# Patient Record
Sex: Male | Born: 1950 | Race: White | Hispanic: No | Marital: Married | Smoking: Never smoker
Health system: Southern US, Community
[De-identification: ages and names within clinical notes are randomized; demographics above are authoritative.]

## PROBLEM LIST (undated history)

## (undated) DIAGNOSIS — R7303 Prediabetes: Secondary | ICD-10-CM

## (undated) DIAGNOSIS — M1990 Unspecified osteoarthritis, unspecified site: Secondary | ICD-10-CM

## (undated) DIAGNOSIS — I219 Acute myocardial infarction, unspecified: Secondary | ICD-10-CM

## (undated) DIAGNOSIS — I10 Essential (primary) hypertension: Secondary | ICD-10-CM

## (undated) HISTORY — PX: MULTIPLE TOOTH EXTRACTIONS: SHX2053

## (undated) HISTORY — DX: Acute myocardial infarction, unspecified: I21.9

## (undated) HISTORY — PX: KNEE ARTHROSCOPY: SHX127

---

## 1997-11-06 ENCOUNTER — Ambulatory Visit (HOSPITAL_COMMUNITY): Admission: RE | Admit: 1997-11-06 | Discharge: 1997-11-06 | Payer: Self-pay | Admitting: Family Medicine

## 1997-11-06 ENCOUNTER — Encounter: Payer: Self-pay | Admitting: Family Medicine

## 2007-05-09 ENCOUNTER — Ambulatory Visit (HOSPITAL_COMMUNITY): Admission: RE | Admit: 2007-05-09 | Discharge: 2007-05-09 | Payer: Self-pay | Admitting: Orthopedic Surgery

## 2007-07-04 ENCOUNTER — Ambulatory Visit (HOSPITAL_BASED_OUTPATIENT_CLINIC_OR_DEPARTMENT_OTHER): Admission: RE | Admit: 2007-07-04 | Discharge: 2007-07-04 | Payer: Self-pay | Admitting: Orthopedic Surgery

## 2009-04-21 IMAGING — CR DG ORBITS FOR FOREIGN BODY
3 series · 3 of 3 positions shown · non-contrast
Comparison: None.

CLINICAL DATA: Pre MRI screening.  History of having metal
fragments removed from I S.

ORBITS FOR FOREIGN BODY - 2 VIEW

[view not recorded (1 of 3)]
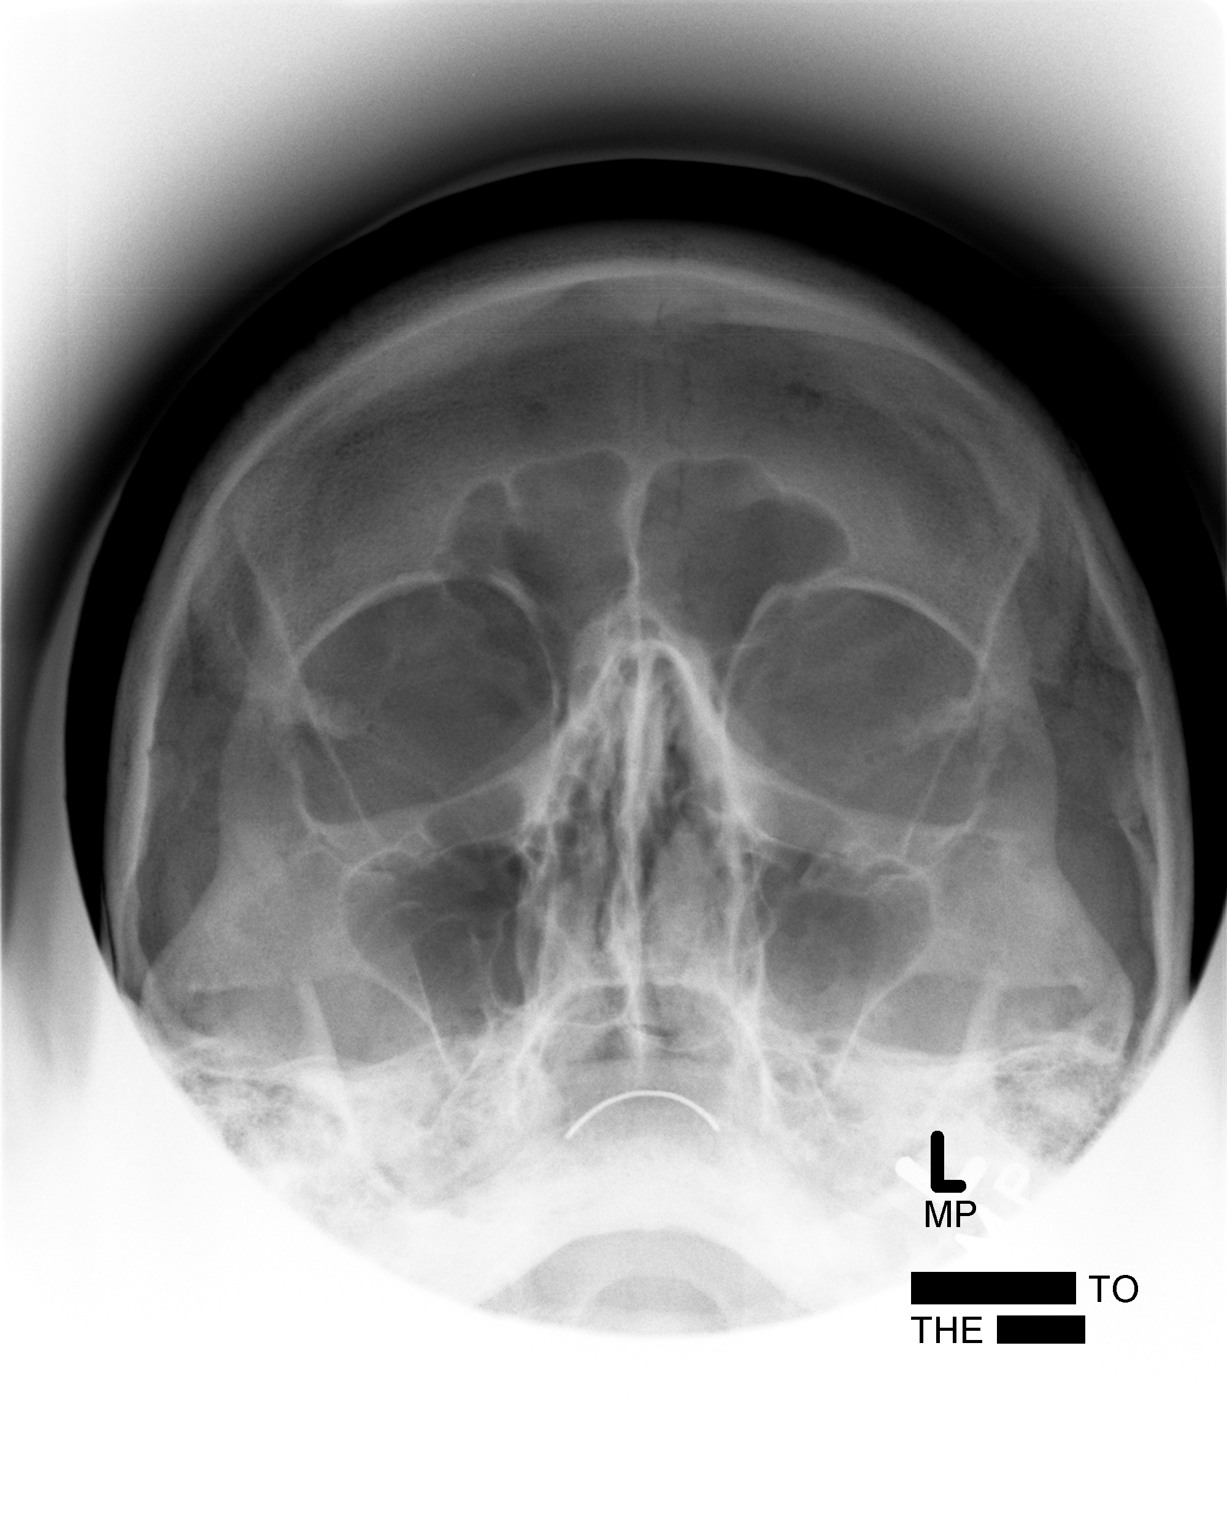

[view not recorded (2 of 3)]
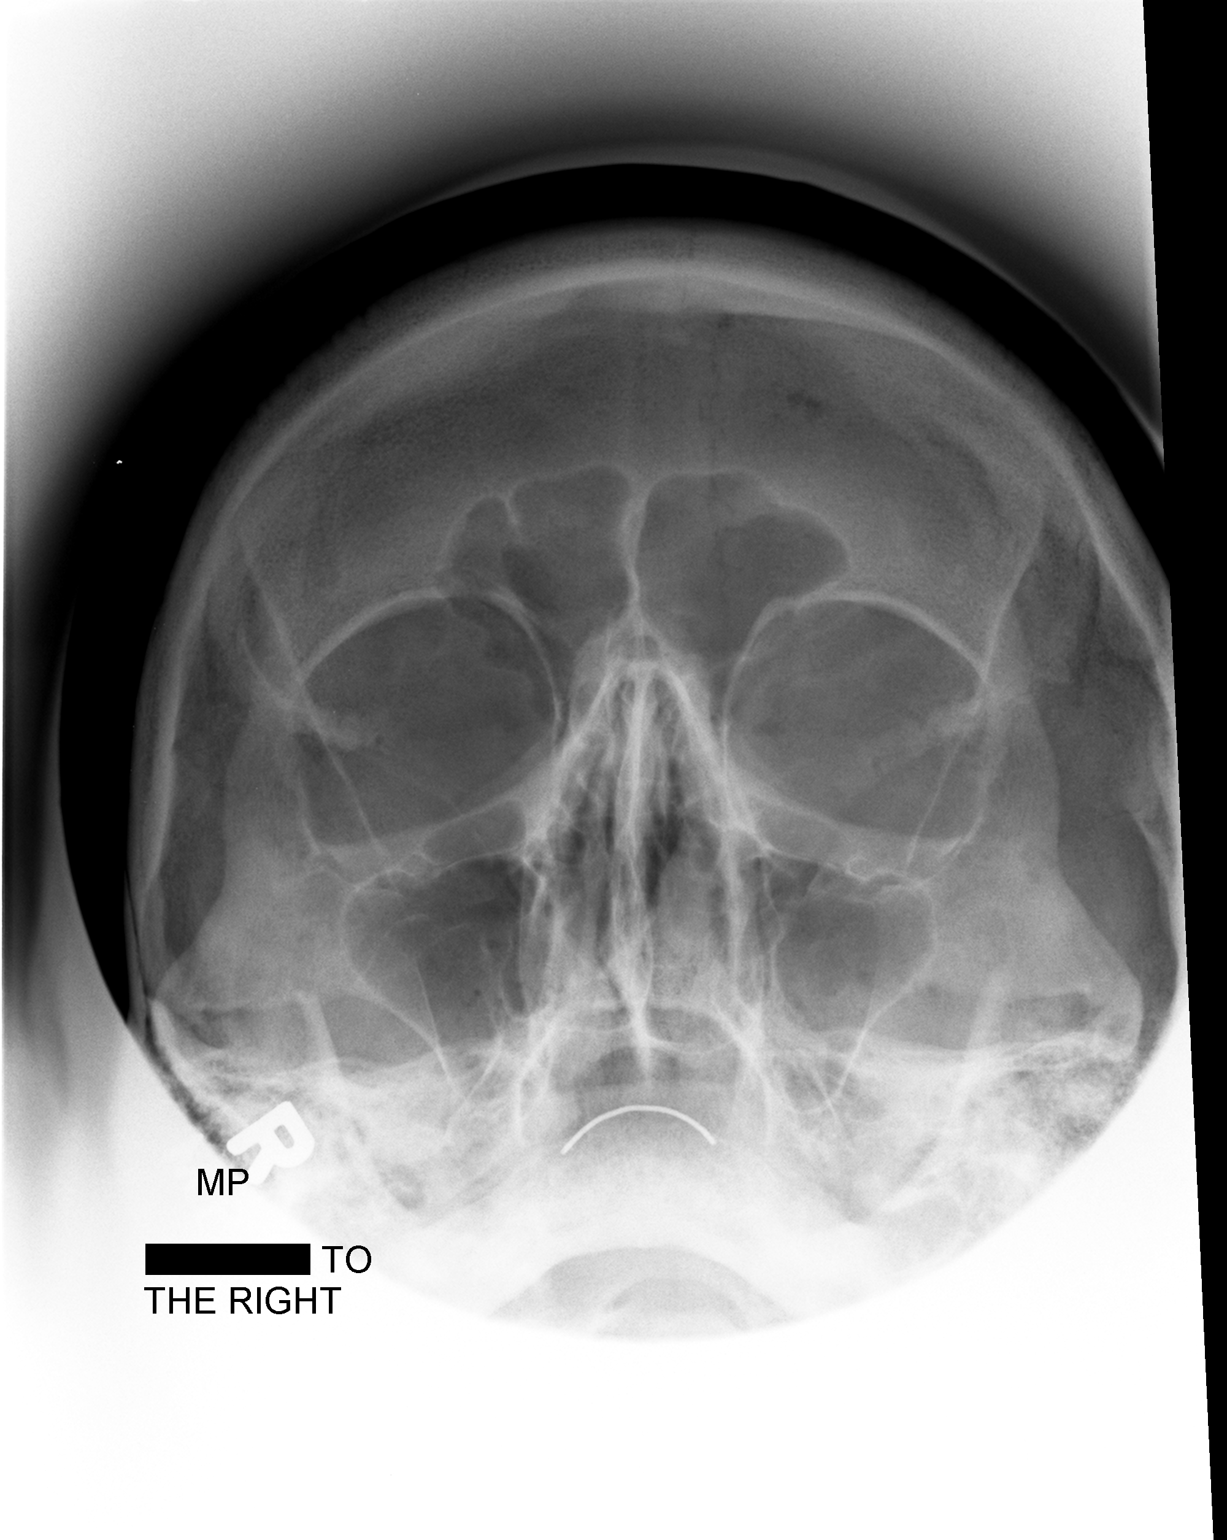

[view not recorded (3 of 3)]
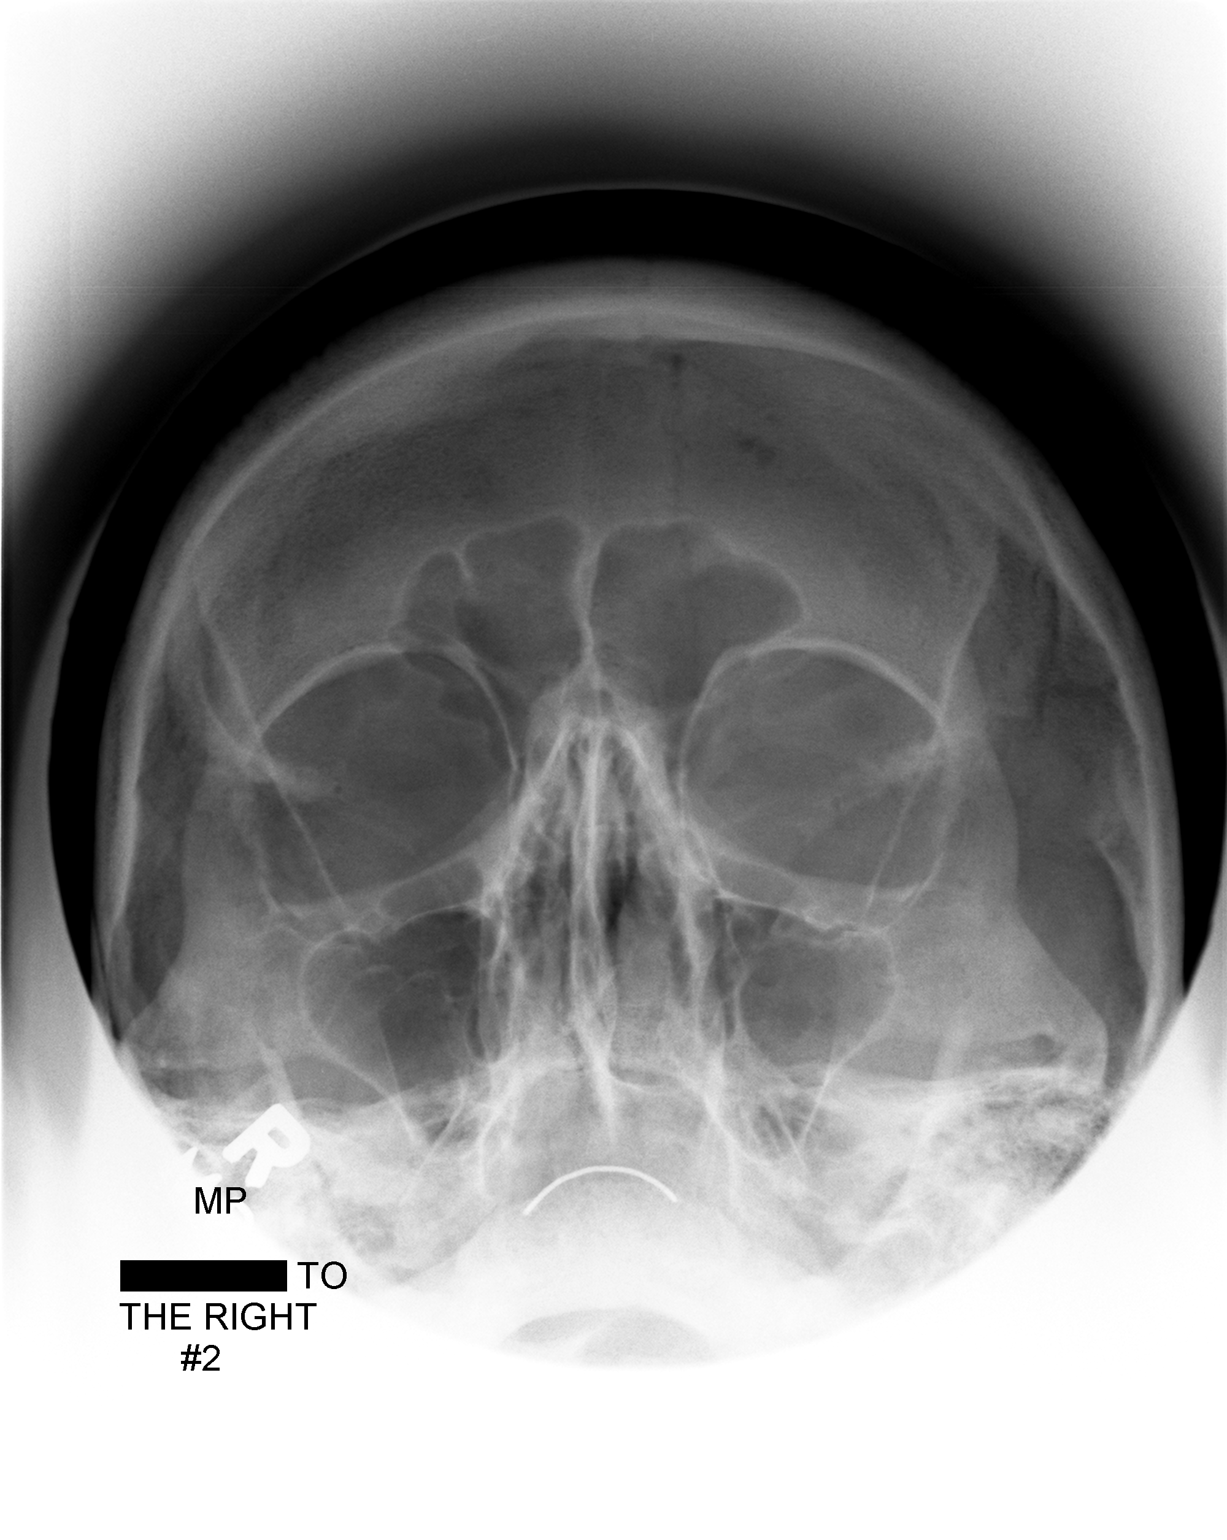

[3 of 3 positions shown; findings below may reference images not displayed]

FINDINGS: On the initial view of the patient looking to the right,
there was a minute foreign body noted in the lateral aspect of the
right orbit.  This view was repeated and the density disappeared.
I feel that it was due to a film artifact.
IMPRESSION: Negative for metallic orbital foreign body.

## 2010-05-23 NOTE — Op Note (Signed)
NAME:  David Tyler, David Tyler              ACCOUNT NO.:  [id number]   MEDICAL RECORD NO.:  [id number]          PATIENT TYPE:  AMB   LOCATION:  [facility]                         FACILITY:  [facility]   PHYSICIAN:  David Tyler, M.D.    DATE OF BIRTH:  01-11-1950   DATE OF PROCEDURE:  07/04/2007  DATE OF DISCHARGE:                               OPERATIVE REPORT   PREOPERATIVE DIAGNOSIS:  Left knee medial meniscal tear.   POSTOPERATIVE DIAGNOSES:  1. Left knee medial meniscal tear.  2. Large chondral defects, medial femoral condyle.   PROCEDURE:  Left knee arthroscopy with medial meniscal debridement and  chondroplasty.   SURGEON:  David Tyler, M.D.  No assistant.   ANESTHESIA:  General.   BLOOD LOSS:  Minimal.   DRAINS:  None.   COMPLICATIONS:  None.   CONDITION:  Stable to recovery.   CLINICAL NOTE:  David Tyler is a 60 year old male with a several-month  history significant left knee pain and mechanical symptoms.  He had an  exam and history consistent with a medial meniscal tear, confirmed by  MRI.  He presents now for arthroscopy with debridement.   PROCEDURE IN DETAIL:  After successful administration of general  anesthetic a tourniquet is placed high on his right thigh and the right  lower extremity is prepped and draped in the usual sterile fashion.  Standard superomedial and inferolateral incisions are made, inflow  cannula passed superomedial, camera passed inferolateral.  Arthroscopic  visualization proceeds.  The undersurface of the patella looks normal.  Trochlea has some grade III change but no full-thickness cartilage  defect.  Medial and lateral gutters were visualized and there are no  loose bodies.  Flexion and valgus force is applied to the knee and the  medial compartment is entered.  It does have a tear on the body and  posterior horn of the medial meniscus.  The spinal needle is used to  localize the inferomedial portal, a small incision made, dilator placed,  and  the meniscus is then debrided back to a stable base with baskets and  a 4.2-mm shaver and sealed off with the ArthroCare device.  I then  inspected the rest of the medial compartment.  There are two distinct,  large chondral flaps in the medial compartment.  One is at the medial-  most margin somewhat anteromedial, the other is more posteromedial and  more towards the center of the joint.  I used the shaver to remove the  unstable cartilage from the surface of the bone.  The one defect most  medial was about 1 x 1 cm in size.  Debrided back to stable bony base  and stable cartilaginous edges and abraded the bone for, hopefully, some  fibrocartilage formation.  The more posterior and central defect is also  debrided back to a stable bony base with stable cartilaginous edges.  This is about 1 x 2 cm in size.  The rest of the cartilage of the medial  compartment looks fairly normal.  The intercondylar notch is visualized,  the ACL is normal.  The lateral compartment is entered and it  is normal.  I again address the patellofemoral compartment, showing an that  extensive area of grade 3 change in the trochlea but there was no  unstable cartilage and nothing that needed debridement.  The  arthroscopic equipment was then removed from the inferior portals, which  were closed with interrupted 4-0 nylon.  Marcaine 0.25% with epinephrine  20 mL injected through the inflow cannula and that is removed and that  portal closed with nylon.  A bulky sterile dressing is applied and he is  then awakened and transported to recovery in stable condition.      David Tyler, M.D.  Electronically Signed     FA/MEDQ  D:  07/04/2007  T:  07/04/2007  Job:  [id number]

## 2010-10-05 LAB — POCT I-STAT 4, (NA,K, GLUC, HGB,HCT)
Glucose, Bld: 99
HCT: 46
Hemoglobin: 15.6 — ABNORMAL HIGH
Operator id: 268271
Potassium: 3.7
Sodium: 138

## 2015-01-31 DIAGNOSIS — N471 Phimosis: Secondary | ICD-10-CM | POA: Diagnosis not present

## 2015-03-09 DIAGNOSIS — N471 Phimosis: Secondary | ICD-10-CM | POA: Diagnosis not present

## 2015-03-09 DIAGNOSIS — Z0000 Encounter for general adult medical examination without abnormal findings: Secondary | ICD-10-CM | POA: Diagnosis not present

## 2015-06-09 DIAGNOSIS — Z0000 Encounter for general adult medical examination without abnormal findings: Secondary | ICD-10-CM | POA: Diagnosis not present

## 2015-06-09 DIAGNOSIS — G629 Polyneuropathy, unspecified: Secondary | ICD-10-CM | POA: Diagnosis not present

## 2015-06-09 DIAGNOSIS — M654 Radial styloid tenosynovitis [de Quervain]: Secondary | ICD-10-CM | POA: Diagnosis not present

## 2015-06-09 DIAGNOSIS — I10 Essential (primary) hypertension: Secondary | ICD-10-CM | POA: Diagnosis not present

## 2015-06-09 DIAGNOSIS — R7309 Other abnormal glucose: Secondary | ICD-10-CM | POA: Diagnosis not present

## 2015-06-09 DIAGNOSIS — E669 Obesity, unspecified: Secondary | ICD-10-CM | POA: Diagnosis not present

## 2015-06-09 DIAGNOSIS — E782 Mixed hyperlipidemia: Secondary | ICD-10-CM | POA: Diagnosis not present

## 2015-07-20 DIAGNOSIS — N471 Phimosis: Secondary | ICD-10-CM | POA: Diagnosis not present

## 2015-07-20 DIAGNOSIS — N5201 Erectile dysfunction due to arterial insufficiency: Secondary | ICD-10-CM | POA: Diagnosis not present

## 2015-07-29 DIAGNOSIS — M1811 Unilateral primary osteoarthritis of first carpometacarpal joint, right hand: Secondary | ICD-10-CM | POA: Diagnosis not present

## 2015-07-29 DIAGNOSIS — M1812 Unilateral primary osteoarthritis of first carpometacarpal joint, left hand: Secondary | ICD-10-CM | POA: Diagnosis not present

## 2015-09-20 DIAGNOSIS — M1811 Unilateral primary osteoarthritis of first carpometacarpal joint, right hand: Secondary | ICD-10-CM | POA: Diagnosis not present

## 2015-09-20 DIAGNOSIS — M1812 Unilateral primary osteoarthritis of first carpometacarpal joint, left hand: Secondary | ICD-10-CM | POA: Diagnosis not present

## 2016-07-19 DIAGNOSIS — Z125 Encounter for screening for malignant neoplasm of prostate: Secondary | ICD-10-CM | POA: Diagnosis not present

## 2016-07-19 DIAGNOSIS — R7309 Other abnormal glucose: Secondary | ICD-10-CM | POA: Diagnosis not present

## 2016-07-19 DIAGNOSIS — Z1159 Encounter for screening for other viral diseases: Secondary | ICD-10-CM | POA: Diagnosis not present

## 2016-07-19 DIAGNOSIS — E782 Mixed hyperlipidemia: Secondary | ICD-10-CM | POA: Diagnosis not present

## 2016-07-19 DIAGNOSIS — I10 Essential (primary) hypertension: Secondary | ICD-10-CM | POA: Diagnosis not present

## 2016-07-19 DIAGNOSIS — Z0000 Encounter for general adult medical examination without abnormal findings: Secondary | ICD-10-CM | POA: Diagnosis not present

## 2016-07-19 DIAGNOSIS — E669 Obesity, unspecified: Secondary | ICD-10-CM | POA: Diagnosis not present

## 2016-07-19 DIAGNOSIS — G629 Polyneuropathy, unspecified: Secondary | ICD-10-CM | POA: Diagnosis not present

## 2016-09-25 DIAGNOSIS — I10 Essential (primary) hypertension: Secondary | ICD-10-CM | POA: Diagnosis not present

## 2016-09-25 DIAGNOSIS — E782 Mixed hyperlipidemia: Secondary | ICD-10-CM | POA: Diagnosis not present

## 2016-10-15 DIAGNOSIS — I10 Essential (primary) hypertension: Secondary | ICD-10-CM | POA: Diagnosis not present

## 2016-10-15 DIAGNOSIS — E782 Mixed hyperlipidemia: Secondary | ICD-10-CM | POA: Diagnosis not present

## 2017-07-04 DIAGNOSIS — Z0000 Encounter for general adult medical examination without abnormal findings: Secondary | ICD-10-CM | POA: Diagnosis not present

## 2017-07-04 DIAGNOSIS — Z23 Encounter for immunization: Secondary | ICD-10-CM | POA: Diagnosis not present

## 2017-07-04 DIAGNOSIS — Z1389 Encounter for screening for other disorder: Secondary | ICD-10-CM | POA: Diagnosis not present

## 2017-07-04 DIAGNOSIS — Z6836 Body mass index (BMI) 36.0-36.9, adult: Secondary | ICD-10-CM | POA: Diagnosis not present

## 2017-07-04 DIAGNOSIS — E782 Mixed hyperlipidemia: Secondary | ICD-10-CM | POA: Diagnosis not present

## 2017-07-04 DIAGNOSIS — G629 Polyneuropathy, unspecified: Secondary | ICD-10-CM | POA: Diagnosis not present

## 2017-07-04 DIAGNOSIS — R7309 Other abnormal glucose: Secondary | ICD-10-CM | POA: Diagnosis not present

## 2017-07-04 DIAGNOSIS — I10 Essential (primary) hypertension: Secondary | ICD-10-CM | POA: Diagnosis not present

## 2017-08-09 DIAGNOSIS — R6884 Jaw pain: Secondary | ICD-10-CM | POA: Diagnosis not present

## 2017-08-21 ENCOUNTER — Other Ambulatory Visit: Payer: Self-pay | Admitting: Family Medicine

## 2017-08-21 DIAGNOSIS — R6884 Jaw pain: Secondary | ICD-10-CM

## 2017-08-26 ENCOUNTER — Ambulatory Visit
Admission: RE | Admit: 2017-08-26 | Discharge: 2017-08-26 | Disposition: A | Discharge status: Home / Self Care | Payer: PPO | Source: Ambulatory Visit | Attending: Family Medicine | Admitting: Family Medicine

## 2017-08-26 DIAGNOSIS — R6884 Jaw pain: Secondary | ICD-10-CM

## 2018-08-07 DIAGNOSIS — R7309 Other abnormal glucose: Secondary | ICD-10-CM | POA: Diagnosis not present

## 2018-08-07 DIAGNOSIS — Z0000 Encounter for general adult medical examination without abnormal findings: Secondary | ICD-10-CM | POA: Diagnosis not present

## 2018-08-07 DIAGNOSIS — Z125 Encounter for screening for malignant neoplasm of prostate: Secondary | ICD-10-CM | POA: Diagnosis not present

## 2018-08-07 DIAGNOSIS — G629 Polyneuropathy, unspecified: Secondary | ICD-10-CM | POA: Diagnosis not present

## 2018-08-07 DIAGNOSIS — E782 Mixed hyperlipidemia: Secondary | ICD-10-CM | POA: Diagnosis not present

## 2018-08-07 DIAGNOSIS — Z1389 Encounter for screening for other disorder: Secondary | ICD-10-CM | POA: Diagnosis not present

## 2018-08-07 DIAGNOSIS — I10 Essential (primary) hypertension: Secondary | ICD-10-CM | POA: Diagnosis not present

## 2018-08-14 DIAGNOSIS — R7309 Other abnormal glucose: Secondary | ICD-10-CM | POA: Diagnosis not present

## 2018-08-14 DIAGNOSIS — E782 Mixed hyperlipidemia: Secondary | ICD-10-CM | POA: Diagnosis not present

## 2018-08-14 DIAGNOSIS — I10 Essential (primary) hypertension: Secondary | ICD-10-CM | POA: Diagnosis not present

## 2018-08-14 DIAGNOSIS — Z125 Encounter for screening for malignant neoplasm of prostate: Secondary | ICD-10-CM | POA: Diagnosis not present

## 2019-08-09 IMAGING — CT CT MAXILLOFACIAL W/O CM
1 series · 8 of 30 positions shown, 10 images · non-contrast
Comparison: None.

CLINICAL DATA: Left-sided jaw pain for the past 3 weeks.

EXAM:
CT MAXILLOFACIAL WITHOUT CONTRAST
TECHNIQUE: Multidetector CT imaging of the maxillofacial structures was
performed. Multiplanar CT image reconstructions were also generated.

[Series 7: cor soft · coronal · 0.35mm/px · 8 of 111 slices shown, 10 images]
[im 8/111  brain]
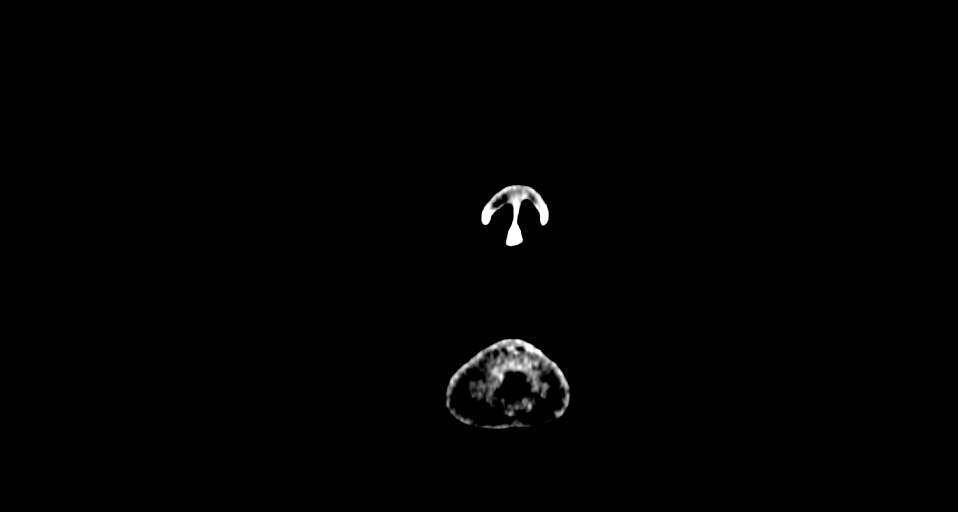
[im 8/111  bone]
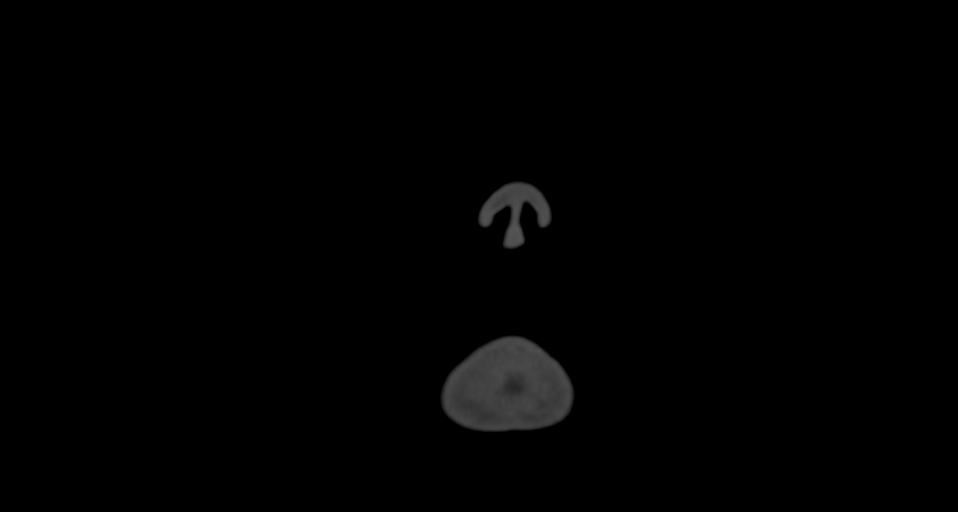
[im 23/111  bone]
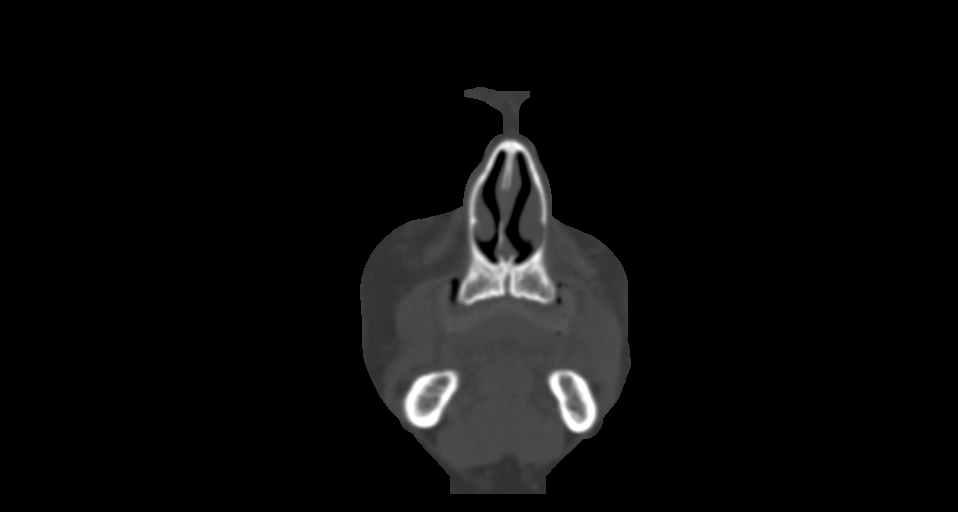
[im 35/111  bone]
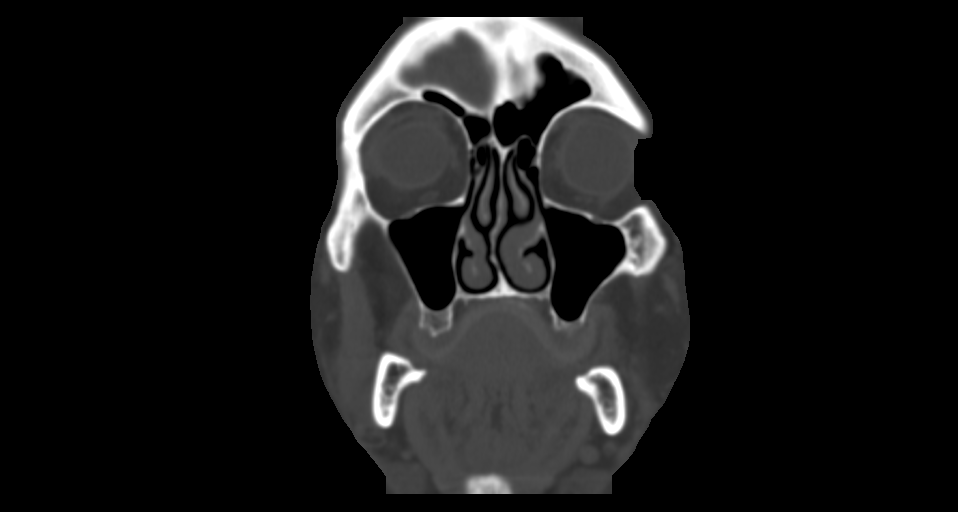
[im 50/111  bone]
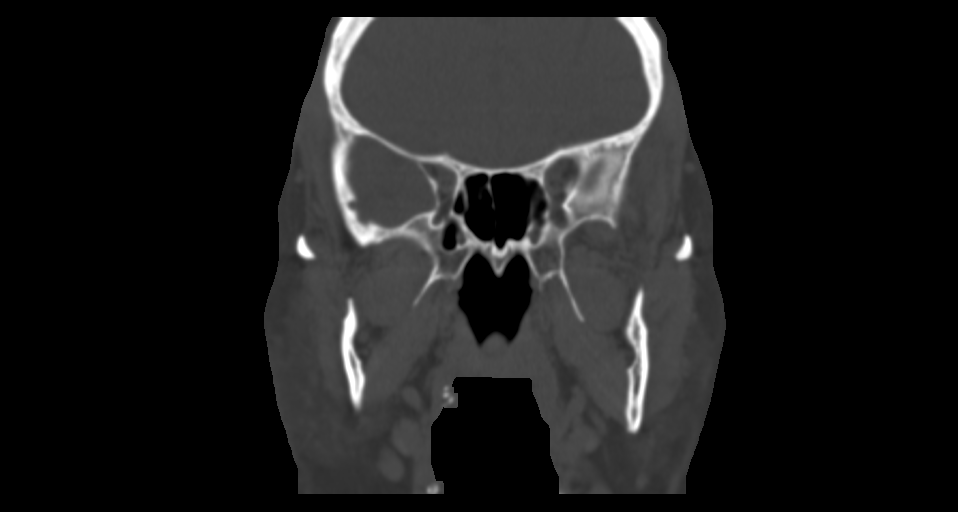
[im 61/111  brain]
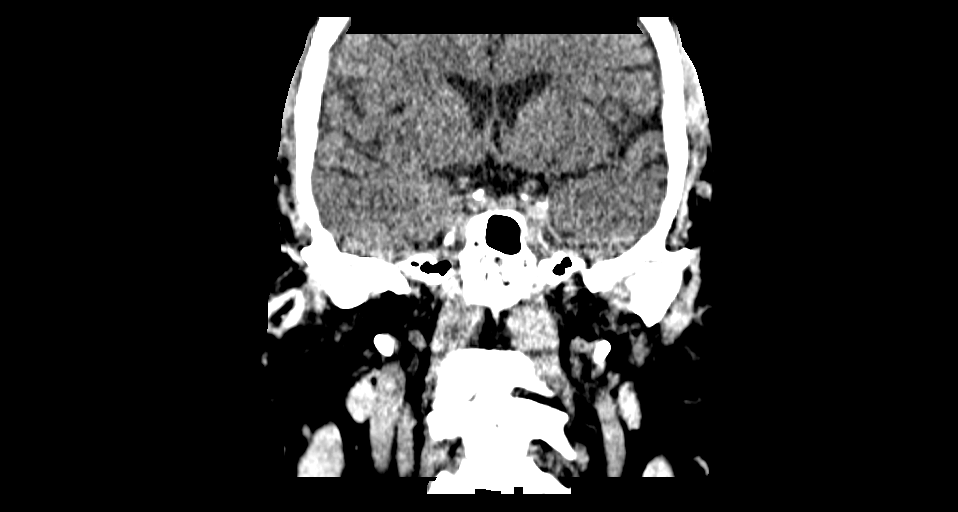
[im 61/111  bone]
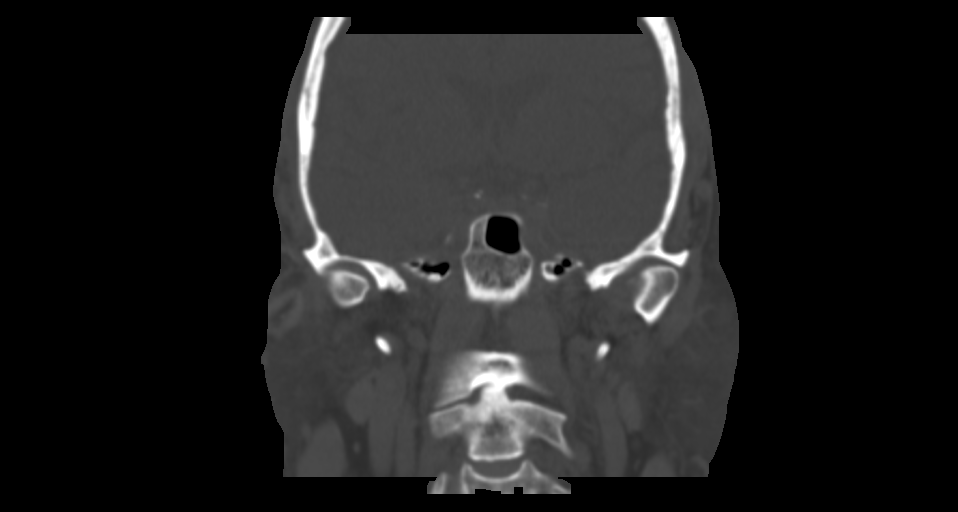
[im 76/111  bone]
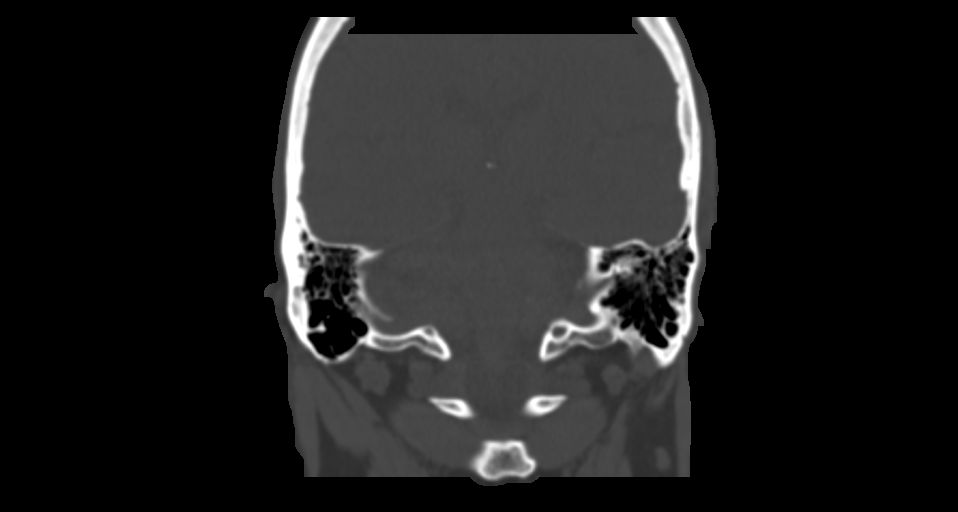
[im 88/111  bone]
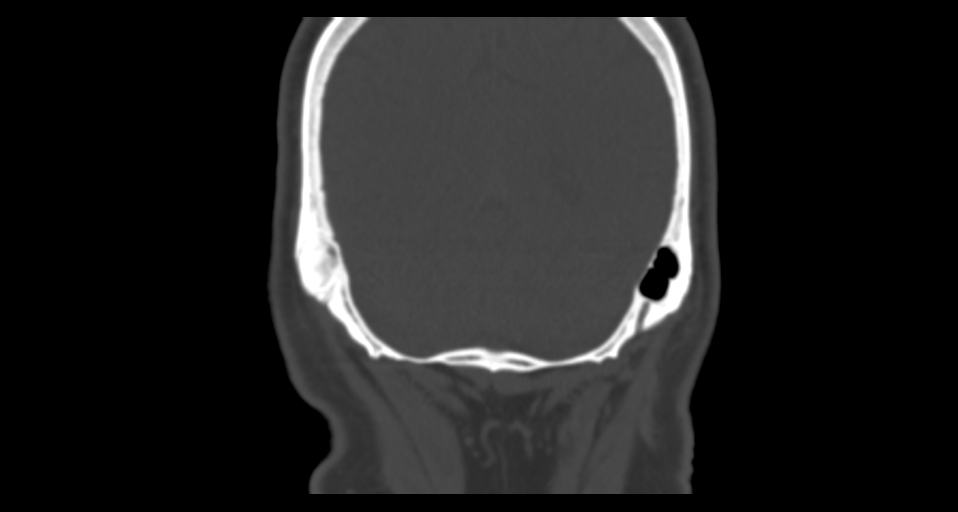
[im 103/111  bone]
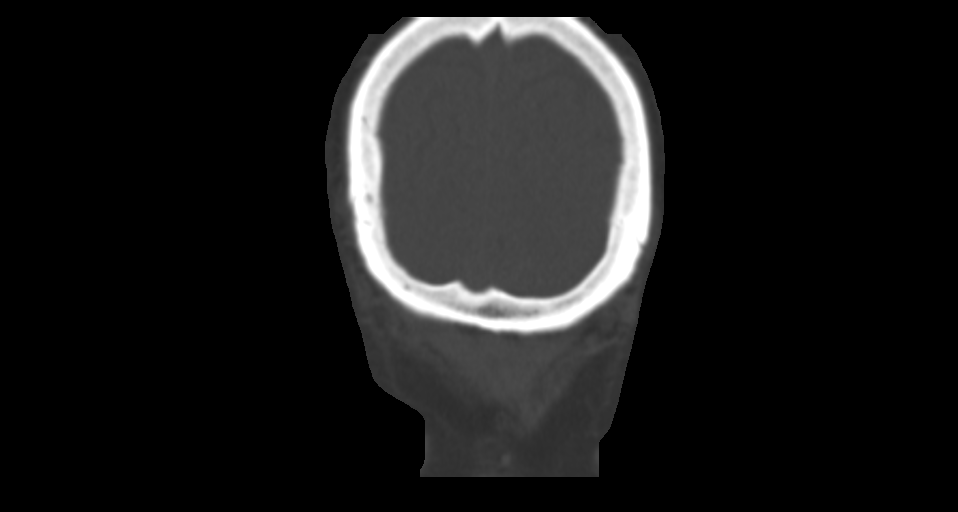

[8 of 30 positions shown; findings below may reference images not displayed]

FINDINGS: Osseous: No fracture or mandibular dislocation. No destructive
process. No subperiosteal abscess. The patient is edentulous.

Orbits: Negative. No traumatic or inflammatory finding.

Sinuses: Clear.

Soft tissues: Negative. No inflammatory changes. No soft tissue mass
or fluid collection.

Limited intracranial: No significant or unexpected finding.
IMPRESSION: 1. Normal maxillofacial CT.

## 2019-09-25 DIAGNOSIS — I10 Essential (primary) hypertension: Secondary | ICD-10-CM | POA: Diagnosis not present

## 2019-09-25 DIAGNOSIS — Z23 Encounter for immunization: Secondary | ICD-10-CM | POA: Diagnosis not present

## 2019-09-25 DIAGNOSIS — G629 Polyneuropathy, unspecified: Secondary | ICD-10-CM | POA: Diagnosis not present

## 2019-09-25 DIAGNOSIS — Z0000 Encounter for general adult medical examination without abnormal findings: Secondary | ICD-10-CM | POA: Diagnosis not present

## 2019-09-25 DIAGNOSIS — R7309 Other abnormal glucose: Secondary | ICD-10-CM | POA: Diagnosis not present

## 2019-09-25 DIAGNOSIS — E782 Mixed hyperlipidemia: Secondary | ICD-10-CM | POA: Diagnosis not present

## 2019-09-25 DIAGNOSIS — Z1389 Encounter for screening for other disorder: Secondary | ICD-10-CM | POA: Diagnosis not present

## 2020-06-21 ENCOUNTER — Encounter (HOSPITAL_COMMUNITY): Payer: Self-pay

## 2020-06-21 ENCOUNTER — Emergency Department (HOSPITAL_COMMUNITY)
Admission: EM | Admit: 2020-06-21 | Discharge: 2020-06-22 | Disposition: A | Discharge status: Home / Self Care | Payer: PPO | Attending: Emergency Medicine | Admitting: Emergency Medicine

## 2020-06-21 ENCOUNTER — Other Ambulatory Visit: Payer: Self-pay

## 2020-06-21 ENCOUNTER — Ambulatory Visit (HOSPITAL_COMMUNITY)
Admission: EM | Admit: 2020-06-21 | Discharge: 2020-06-21 | Disposition: A | Discharge status: Home / Self Care | Payer: PPO

## 2020-06-21 DIAGNOSIS — Z5321 Procedure and treatment not carried out due to patient leaving prior to being seen by health care provider: Secondary | ICD-10-CM | POA: Insufficient documentation

## 2020-06-21 DIAGNOSIS — X58XXXA Exposure to other specified factors, initial encounter: Secondary | ICD-10-CM | POA: Diagnosis not present

## 2020-06-21 DIAGNOSIS — T1591XA Foreign body on external eye, part unspecified, right eye, initial encounter: Secondary | ICD-10-CM

## 2020-06-21 DIAGNOSIS — T1501XA Foreign body in cornea, right eye, initial encounter: Secondary | ICD-10-CM | POA: Diagnosis not present

## 2020-06-21 HISTORY — DX: Essential (primary) hypertension: I10

## 2020-06-21 MED ORDER — TETRACAINE HCL 0.5 % OP SOLN: 1.0000 [drp] | Freq: Once | OPHTHALMIC | Status: DC

## 2020-06-21 MED ORDER — TETRACAINE HCL 0.5 % OP SOLN: 2.0000 [drp] | Freq: Once | OPHTHALMIC | Status: DC

## 2020-06-21 MED ORDER — FLUORESCEIN SODIUM 1 MG OP STRP: 1.0000 | ORAL_STRIP | Freq: Once | OPHTHALMIC | Status: DC

## 2020-06-21 NOTE — ED Triage Notes (Signed)
Pt c/o having a piece of metal in his right eye. Pt states his eyes have been watery all day.

## 2020-06-21 NOTE — Discharge Instructions (Addendum)
-  Please head straight to [facility] or [facility] emergency department for further evaluation and management of your eye issue.  The risk of metal in the eye that is untreated is a complication that can lead to permanent vision loss.  This is an urgent issue and should be evaluated immediately.  If you do not go straight to the emergency department for this evaluation, you are at risk of permanent vision changes and loss.

## 2020-06-21 NOTE — ED Provider Notes (Signed)
[facility]    CSN: [id number] Arrival date & time: 06/21/20  1924      History   Chief Complaint Chief Complaint  Patient presents with   Foreign Body in Eye    Metal in left    HPI David Tyler is a 70 y.o. male presenting with possible piece of metal L eye. 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 Tried to flush it but it's still visible. Redness, irritation and foreign body sensation. Doesn't wear corrective glasses or contacts.  Denies photophobia, eye crusting in the morning, eye pain, eye pain with movement, vision changes, double vision, excessive tearing, burning eyes   HPI  Past Medical History:  Diagnosis Date   Hypertension     There are no problems to display for this patient.   History reviewed. No pertinent surgical history.     Home Medications    Prior to Admission medications   Not on File    Family History History reviewed. No pertinent family history.  Social History Social History   Tobacco Use   Smoking status: Never   Smokeless tobacco: Never  Substance Use Topics   Alcohol use: Yes   Drug use: Never     Allergies   Hydrocodone   Review of Systems Review of Systems  Eyes:        Metal in R eye  All other systems reviewed and are negative.   Physical Exam Triage Vital Signs ED Triage Vitals  Enc Vitals Group     BP 06/21/20 2015 (!) 158/89     Pulse Rate 06/21/20 2013 65     Resp 06/21/20 2013 19     Temp 06/21/20 2013 98.6 F (37 C)     Temp Source 06/21/20 2013 Oral     SpO2 06/21/20 2013 99 %     Weight --      Height --      Head Circumference --      Peak Flow --      Pain Score --      Pain Loc --      Pain Edu? --      Excl. in GC? --    No data found.  Updated Vital Signs BP (!) 158/89 (BP Location: Right Arm)   Pulse 65   Temp 98.6 F (37 C) (Oral)   Resp 19   SpO2 99%   Visual Acuity Right Eye  Distance: 20/70 Left Eye Distance: 20/70 Bilateral Distance: 20/50  Right Eye Near:   Left Eye Near:    Bilateral Near:     Physical Exam Vitals reviewed.  Constitutional:      Appearance: Normal appearance.  HENT:     Head: Normocephalic and atraumatic.     Right Ear: Tympanic membrane, ear canal and external ear normal. There is no impacted cerumen.     Left Ear: Tympanic membrane, ear canal and external ear normal. There is no impacted cerumen.     Nose: Nose normal. No congestion.     Mouth/Throat:     Pharynx: Oropharynx is clear. No posterior oropharyngeal erythema.  Eyes:     General: Lids are normal. Vision grossly intact. Gaze aligned appropriately. No visual field deficit.       Right eye: Foreign body present. No discharge or hordeolum.        Left eye: No foreign body, discharge or  hordeolum.     Extraocular Movements: Extraocular movements intact.     Right eye: Normal extraocular motion and no nystagmus.     Left eye: Normal extraocular motion and no nystagmus.     Conjunctiva/sclera:     Right eye: Right conjunctiva is injected. No chemosis, exudate or hemorrhage.    Left eye: Left conjunctiva is not injected. No chemosis, exudate or hemorrhage.    Pupils: Pupils are equal, round, and reactive to light.     Visual Fields: Right eye visual fields normal and left eye visual fields normal.      Comments: 7mm circular piece of metal visible at 1 o'clock R iris. PERRLA, EOMI without pain. No orbital tenderness.   Cardiovascular:     Rate and Rhythm: Normal rate and regular rhythm.     Heart sounds: Normal heart sounds.  Pulmonary:     Effort: Pulmonary effort is normal.     Breath sounds: Normal breath sounds.  Neurological:     General: No focal deficit present.     Mental Status: He is alert.  Psychiatric:        Mood and Affect: Mood normal.        Behavior: Behavior normal.        Thought Content: Thought content normal.        Judgment: Judgment normal.      UC Treatments / Results  Labs (all labs ordered are listed, but only abnormal results are displayed) Labs Reviewed - No data to display  EKG   Radiology No results found.  Procedures Procedures (including critical care time)  Medications Ordered in UC Medications - No data to display  Initial Impression / Assessment and Plan / UC Course  I have reviewed the triage vital signs and the nursing notes.  Pertinent labs & imaging results that were available during my care of the patient were reviewed by me and considered in my medical decision making (see chart for details).     This patient is a 70 year old male presenting with piece of metal R eye. Visual acuity intact.   I am recommending this patient head straight to ED for removal of foreign body and optho consult. Discussed risk of vision loss of metal is not appropriately removed. Given time of day, it is not possible to have his performed as an outpatient optho consult. Patient verbalizes understanding, but that he will not go to the ED. He understands that he could lose his vision if he does not head straight to ED. Verbalizes this multiple times.    Final Clinical Impressions(s) / UC Diagnoses   Final diagnoses:  Foreign body of right eye, initial encounter     Discharge Instructions      -Please head straight to [facility] or [facility] emergency department for further evaluation and management of your eye issue.  The risk of metal in the eye that is untreated is a complication that can lead to permanent vision loss.  This is an urgent issue and should be evaluated immediately.  If you do not go straight to the emergency department for this evaluation, you are at risk of permanent vision changes and loss.     ED Prescriptions   None    PDMP not reviewed this encounter.   Rhys Martini, PA-C 06/24/20 [phone]

## 2020-06-21 NOTE — ED Provider Notes (Signed)
Emergency Medicine Provider Triage Evaluation Note  David Tyler , a 70 y.o. male  was evaluated in triage.  Pt complains of metal in right eye   Review of Systems  Positive:  Negative: No visual change  Physical Exam  BP (!) 147/113   Pulse 67   Temp 98.1 F (36.7 C) (Oral)   Resp 18   SpO2 97%  Gen:   Awake, no distress   Resp:  Normal effort  MSK:   Moves extremities without difficulty  Other:    Medical Decision Making  Medically screening exam initiated at 9:18 PM.  Appropriate orders placed.  Claiborne Billings was informed that the remainder of the evaluation will be completed by another provider, this initial triage assessment does not replace that evaluation, and the importance of remaining in the ED until their evaluation is complete.     Elson Areas, [place] 06/22/20 1516    Gwyneth Sprout, MD 06/24/20 1404

## 2020-06-21 NOTE — ED Triage Notes (Signed)
Pt states that he got some metal in his R eye from the lawn mower.

## 2020-06-22 NOTE — ED Notes (Signed)
Pt states that he is leaving and going to the eye doctor in the morning.

## 2020-06-22 NOTE — ED Notes (Signed)
Pt left AMA °

## 2020-06-23 DIAGNOSIS — T1501XA Foreign body in cornea, right eye, initial encounter: Secondary | ICD-10-CM | POA: Diagnosis not present

## 2020-10-06 DIAGNOSIS — Z0000 Encounter for general adult medical examination without abnormal findings: Secondary | ICD-10-CM | POA: Diagnosis not present

## 2020-10-06 DIAGNOSIS — Z23 Encounter for immunization: Secondary | ICD-10-CM | POA: Diagnosis not present

## 2020-10-06 DIAGNOSIS — G629 Polyneuropathy, unspecified: Secondary | ICD-10-CM | POA: Diagnosis not present

## 2020-10-06 DIAGNOSIS — Z1389 Encounter for screening for other disorder: Secondary | ICD-10-CM | POA: Diagnosis not present

## 2020-10-06 DIAGNOSIS — R7309 Other abnormal glucose: Secondary | ICD-10-CM | POA: Diagnosis not present

## 2020-10-06 DIAGNOSIS — E782 Mixed hyperlipidemia: Secondary | ICD-10-CM | POA: Diagnosis not present

## 2020-10-06 DIAGNOSIS — I10 Essential (primary) hypertension: Secondary | ICD-10-CM | POA: Diagnosis not present

## 2021-03-02 DIAGNOSIS — M549 Dorsalgia, unspecified: Secondary | ICD-10-CM | POA: Diagnosis not present

## 2021-03-02 DIAGNOSIS — I10 Essential (primary) hypertension: Secondary | ICD-10-CM | POA: Diagnosis not present

## 2021-03-02 DIAGNOSIS — R21 Rash and other nonspecific skin eruption: Secondary | ICD-10-CM | POA: Diagnosis not present

## 2021-06-04 DIAGNOSIS — L309 Dermatitis, unspecified: Secondary | ICD-10-CM | POA: Diagnosis not present

## 2021-06-08 DIAGNOSIS — L239 Allergic contact dermatitis, unspecified cause: Secondary | ICD-10-CM | POA: Diagnosis not present

## 2021-06-12 DIAGNOSIS — R21 Rash and other nonspecific skin eruption: Secondary | ICD-10-CM | POA: Diagnosis not present

## 2021-06-14 DIAGNOSIS — L309 Dermatitis, unspecified: Secondary | ICD-10-CM | POA: Diagnosis not present

## 2021-07-10 DIAGNOSIS — I872 Venous insufficiency (chronic) (peripheral): Secondary | ICD-10-CM | POA: Diagnosis not present

## 2021-07-29 ENCOUNTER — Other Ambulatory Visit: Payer: Self-pay

## 2021-07-29 ENCOUNTER — Emergency Department (HOSPITAL_BASED_OUTPATIENT_CLINIC_OR_DEPARTMENT_OTHER): Payer: PPO | Admitting: Radiology

## 2021-07-29 ENCOUNTER — Encounter (HOSPITAL_BASED_OUTPATIENT_CLINIC_OR_DEPARTMENT_OTHER): Payer: Self-pay | Admitting: Emergency Medicine

## 2021-07-29 ENCOUNTER — Emergency Department (HOSPITAL_BASED_OUTPATIENT_CLINIC_OR_DEPARTMENT_OTHER)
Admission: EM | Admit: 2021-07-29 | Discharge: 2021-07-29 | Disposition: A | Discharge status: Home / Self Care | Payer: PPO | Attending: Emergency Medicine | Admitting: Emergency Medicine

## 2021-07-29 ENCOUNTER — Emergency Department (HOSPITAL_BASED_OUTPATIENT_CLINIC_OR_DEPARTMENT_OTHER): Payer: PPO

## 2021-07-29 DIAGNOSIS — S32010A Wedge compression fracture of first lumbar vertebra, initial encounter for closed fracture: Secondary | ICD-10-CM | POA: Diagnosis not present

## 2021-07-29 DIAGNOSIS — M545 Low back pain, unspecified: Secondary | ICD-10-CM | POA: Diagnosis not present

## 2021-07-29 DIAGNOSIS — M48061 Spinal stenosis, lumbar region without neurogenic claudication: Secondary | ICD-10-CM | POA: Diagnosis not present

## 2021-07-29 DIAGNOSIS — S32018A Other fracture of first lumbar vertebra, initial encounter for closed fracture: Secondary | ICD-10-CM | POA: Diagnosis not present

## 2021-07-29 DIAGNOSIS — S3992XA Unspecified injury of lower back, initial encounter: Secondary | ICD-10-CM | POA: Diagnosis present

## 2021-07-29 DIAGNOSIS — S300XXA Contusion of lower back and pelvis, initial encounter: Secondary | ICD-10-CM | POA: Insufficient documentation

## 2021-07-29 DIAGNOSIS — M5126 Other intervertebral disc displacement, lumbar region: Secondary | ICD-10-CM | POA: Diagnosis not present

## 2021-07-29 DIAGNOSIS — Z043 Encounter for examination and observation following other accident: Secondary | ICD-10-CM | POA: Diagnosis not present

## 2021-07-29 MED ORDER — OXYCODONE-ACETAMINOPHEN 5-325 MG PO TABS: 1.0000 | ORAL_TABLET | ORAL | 0 refills | Status: DC | PRN

## 2021-07-29 NOTE — ED Notes (Signed)
Larey Seat Weds this week, while getting out of his truck. Sounds like a mechanical fall. Fell on his butt, on concrete. No LOC, not on a blood thinner and no numbness/tingling. Able to move all extremities. Pain increases while standing from a sitting position. Pain is in his lower back, down to his coccyx and radiates when he stands around his hips on both sides. Has been able to ambulate since the fall. Pain rates a 6/10.

## 2021-07-29 NOTE — ED Notes (Signed)
Pt informed of not driving or drinking while on the prescription.

## 2021-07-29 NOTE — ED Notes (Signed)
Discharge paperwork given and understood. 

## 2021-07-29 NOTE — Discharge Instructions (Addendum)
Follow up with your Physician for recheck in 1 week.  Follow up with neurosurgery if any problems.

## 2021-07-29 NOTE — ED Triage Notes (Signed)
Pt fell getting out of truck on Wednesday, fell on his sacrum on concrete. Pain is in coccyx, and lower back

## 2021-07-29 NOTE — ED Provider Notes (Signed)
[facility] EMERGENCY DEPT Provider Note   CSN: [id number] Arrival date & time: 07/29/21  8850     History  Chief Complaint  Patient presents with   David Tyler is a 71 y.o. male.  Pt reports he fell getting out of his truck 4 days ago.  Pt complains of pain in his low back.   Pt reports increased pain with walking.  No numbness or weakness.    The history is provided by the patient. No language interpreter was used.  Fall This is a new problem. The problem occurs constantly. The problem has not changed since onset.Pertinent negatives include no chest pain and no abdominal pain. Nothing aggravates the symptoms. Nothing relieves the symptoms. He has tried nothing for the symptoms. The treatment provided no relief.       Home Medications Prior to Admission medications   Not on File      Allergies    Hydrocodone    Review of Systems   Review of Systems  Cardiovascular:  Negative for chest pain.  Gastrointestinal:  Negative for abdominal pain.  Musculoskeletal:  Positive for back pain.  All other systems reviewed and are negative.   Physical Exam Updated Vital Signs BP (!) 150/87 (BP Location: Right Arm)   Pulse (!) 52   Temp 98.1 F (36.7 C) (Oral)   Resp 16   SpO2 97%  Physical Exam Vitals and nursing note reviewed.  Constitutional:      Appearance: He is well-developed.  HENT:     Head: Normocephalic.     Mouth/Throat:     Mouth: Mucous membranes are moist.  Eyes:     Pupils: Pupils are equal, round, and reactive to light.  Cardiovascular:     Rate and Rhythm: Normal rate.  Pulmonary:     Effort: Pulmonary effort is normal.  Abdominal:     General: There is no distension.  Musculoskeletal:        General: Tenderness present.     Cervical back: Normal range of motion.     Comments: Tender lumbar spine and sacral area   Skin:    General: Skin is warm.  Neurological:     General: No focal deficit present.     Mental  Status: He is alert and oriented to person, place, and time.  Psychiatric:        Mood and Affect: Mood normal.     ED Results / Procedures / Treatments   Labs (all labs ordered are listed, but only abnormal results are displayed) Labs Reviewed - No data to display  EKG None  Radiology CT Lumbar Spine Wo Contrast  Result Date: 07/29/2021 CLINICAL DATA:  Provided history: Compression fracture, lumbar. EXAM: CT LUMBAR SPINE WITHOUT CONTRAST TECHNIQUE: Multidetector CT imaging of the lumbar spine was performed without intravenous contrast administration. Multiplanar CT image reconstructions were also generated. RADIATION DOSE REDUCTION: This exam was performed according to the departmental dose-optimization program which includes automated exposure control, adjustment of the mA and/or kV according to patient size and/or use of iterative reconstruction technique. COMPARISON:  Lumbar spine radiographs 07/29/2021. FINDINGS: Segmentation: 5 lumbar vertebrae. The caudal most well-formed interval disc space is designated L5-S1. Alignment: Mild thoracolumbar levocurvature centered at the T11-T12 level. Vertebrae: Age-indeterminate L1 superior endplate vertebral compression fracture (30-40% height loss). No significant bony retropulsion. Vertebral body height is otherwise maintained. Multilevel ventrolateral osteophytes, most prominent at T10-T11 and T11-T12. Paraspinal and other soft tissues: Aortoiliac atherosclerosis. No paraspinal mass,  hematoma or collection. Disc levels: Disc space narrowing within the lumbar and visualized lower thoracic spine. Disc space narrowing is greatest at T11-T12 (mild-to-moderate at this level). L1-L2: Mild facet arthrosis. No appreciable significant disc herniation, spinal canal stenosis or neural foraminal narrowing. L2-L3: Disc bulge. Mild facet arthrosis. Mild spinal canal narrowing. Mild bilateral inferior neural foraminal narrowing. L3-L4: Disc bulge with endplate  spurring. Mild facet arthrosis. Ligamentum flavum hypertrophy. Suspected at least moderate central canal stenosis. Bilateral subarticular narrowing. Mild bilateral neural foraminal narrowing. L4-L5: Disc bulge. Moderate facet arthrosis with ligamentum flavum hypertrophy. Suspected at least moderate central canal stenosis with bilateral subarticular narrowing. Moderate bilateral neural foraminal narrowing. L5-S1: Disc bulge. Mild facet arthrosis. No significant spinal canal stenosis. Bilateral neural foraminal narrowing (moderate right, mild to moderate left). IMPRESSION: Age-indeterminate L1 superior endplate vertebral compression fracture (30-40% height loss). No significant bony retropulsion. Lumbar and lower thoracic spondylosis, as outlined. Multilevel spinal canal stenosis. Most notably, there is suspected at least moderate spinal canal stenosis (with bilateral subarticular narrowing) at L3-L4 and L4-L5. Multilevel neural foraminal narrowing, as detailed and greatest bilaterally at L4-L5 (moderate) and on the right at L5-S1 (moderate). Levocurvature of the thoracolumbar spine. Electronically Signed   By: Jackey Loge D.O.   On: 07/29/2021 14:12   DG Sacrum/Coccyx  Result Date: 07/29/2021 CLINICAL DATA:  Fall out of truck 3 days ago. EXAM: SACRUM AND COCCYX - 2+ VIEW COMPARISON:  None Available. FINDINGS: Normal bone mineralization. No acute fracture is seen. A mid to inferior coccygeal segment is minimally 3 mm anterior to the more superior segment on lateral view. Minimal posterior L5-S1 disc space narrowing. Moderate L5-S1 facet joint arthropathy. The visualized portions of the sacroiliac femoroacetabular and pubic symphysis joint spaces are maintained. IMPRESSION: A mid to inferior coccygeal segment is minimally 3 mm anterior to the more superior segment on lateral view. This may represent the patient's normal anatomy. It is difficult to exclude minimal acute injury in this location without a prior for  comparison. No acute fracture line is seen. Electronically Signed   By: Neita Garnet M.D.   On: 07/29/2021 10:42   DG Lumbar Spine Complete  Result Date: 07/29/2021 CLINICAL DATA:  Status post fall from truck 3 days ago. Pain to the lower portion of lumbar spine. EXAM: LUMBAR SPINE - COMPLETE 4+ VIEW COMPARISON:  None Available. FINDINGS: The alignment of the lumbar spine appears normal. There is a mild, age-indeterminate superior endplate fracture deformity involving the L1 vertebral body with loss of approximately 15% of the superior endplate. The remaining lumbar spine vertebral body heights are maintained. Mild ventral endplate spurring is identified within the thoracic spine. IMPRESSION: Mild, age-indeterminate fracture deformity involving the L1 vertebral body. Correlation with exact sided tenderness is recommended. Electronically Signed   By: Signa Kell M.D.   On: 07/29/2021 10:40    Procedures Procedures    Medications Ordered in ED Medications - No data to display  ED Course/ Medical Decision Making/ A&P                           Medical Decision Making Pt fell 4 days ago and has low back pain since  Amount and/or Complexity of Data Reviewed External Data Reviewed: notes.    Details: primary care notes reviewed Radiology: ordered and independent interpretation performed. Decision-making details documented in ED Course.    Details: Xray ls spine L! superior endplate fx. CT Ls spine  no bony retropulsion, multi  level foraminal narrowing  Risk Prescription drug management. Risk Details: Pt counseled on compression fracture and follow up.  Pt given rx for percocet.            Final Clinical Impression(s) / ED Diagnoses Final diagnoses:  Closed compression fracture of body of L1 vertebra (HCC)  Contusion of lower back, initial encounter    Rx / DC Orders ED Discharge Orders          Ordered    oxyCODONE-acetaminophen (PERCOCET) 5-325 MG tablet  Every 4 hours  PRN        07/29/21 1445           An After Visit Summary was printed and given to the patient.    Elson Areas, [place] 07/29/21 1456    Arby Barrette, MD 07/30/21 1236

## 2021-08-09 DIAGNOSIS — S32009A Unspecified fracture of unspecified lumbar vertebra, initial encounter for closed fracture: Secondary | ICD-10-CM | POA: Diagnosis not present

## 2021-08-09 DIAGNOSIS — Z6835 Body mass index (BMI) 35.0-35.9, adult: Secondary | ICD-10-CM | POA: Diagnosis not present

## 2021-09-12 DIAGNOSIS — L309 Dermatitis, unspecified: Secondary | ICD-10-CM | POA: Diagnosis not present

## 2021-09-12 DIAGNOSIS — L570 Actinic keratosis: Secondary | ICD-10-CM | POA: Diagnosis not present

## 2021-09-12 DIAGNOSIS — L308 Other specified dermatitis: Secondary | ICD-10-CM | POA: Diagnosis not present

## 2021-11-15 DIAGNOSIS — L309 Dermatitis, unspecified: Secondary | ICD-10-CM | POA: Diagnosis not present

## 2021-12-15 DIAGNOSIS — Z1331 Encounter for screening for depression: Secondary | ICD-10-CM | POA: Diagnosis not present

## 2021-12-15 DIAGNOSIS — G629 Polyneuropathy, unspecified: Secondary | ICD-10-CM | POA: Diagnosis not present

## 2021-12-15 DIAGNOSIS — Z0000 Encounter for general adult medical examination without abnormal findings: Secondary | ICD-10-CM | POA: Diagnosis not present

## 2021-12-15 DIAGNOSIS — Z23 Encounter for immunization: Secondary | ICD-10-CM | POA: Diagnosis not present

## 2021-12-15 DIAGNOSIS — I10 Essential (primary) hypertension: Secondary | ICD-10-CM | POA: Diagnosis not present

## 2021-12-15 DIAGNOSIS — R7309 Other abnormal glucose: Secondary | ICD-10-CM | POA: Diagnosis not present

## 2021-12-15 DIAGNOSIS — E782 Mixed hyperlipidemia: Secondary | ICD-10-CM | POA: Diagnosis not present

## 2022-01-05 DIAGNOSIS — M48062 Spinal stenosis, lumbar region with neurogenic claudication: Secondary | ICD-10-CM | POA: Diagnosis not present

## 2022-01-09 ENCOUNTER — Other Ambulatory Visit: Payer: Self-pay | Admitting: Physician Assistant

## 2022-01-09 DIAGNOSIS — M48062 Spinal stenosis, lumbar region with neurogenic claudication: Secondary | ICD-10-CM

## 2022-01-27 ENCOUNTER — Ambulatory Visit
Admission: RE | Admit: 2022-01-27 | Discharge: 2022-01-27 | Disposition: A | Discharge status: Home / Self Care | Payer: PPO | Source: Ambulatory Visit | Attending: Physician Assistant | Admitting: Physician Assistant

## 2022-01-27 DIAGNOSIS — M48062 Spinal stenosis, lumbar region with neurogenic claudication: Secondary | ICD-10-CM

## 2022-01-29 ENCOUNTER — Other Ambulatory Visit: Payer: Self-pay | Admitting: Physician Assistant

## 2022-01-29 DIAGNOSIS — Z77018 Contact with and (suspected) exposure to other hazardous metals: Secondary | ICD-10-CM

## 2022-02-13 ENCOUNTER — Ambulatory Visit
Admission: RE | Admit: 2022-02-13 | Discharge: 2022-02-13 | Disposition: A | Discharge status: Home / Self Care | Payer: PPO | Source: Ambulatory Visit | Attending: Physician Assistant | Admitting: Physician Assistant

## 2022-02-13 DIAGNOSIS — M79605 Pain in left leg: Secondary | ICD-10-CM | POA: Diagnosis not present

## 2022-02-13 DIAGNOSIS — Z0389 Encounter for observation for other suspected diseases and conditions ruled out: Secondary | ICD-10-CM | POA: Diagnosis not present

## 2022-02-13 DIAGNOSIS — M545 Low back pain, unspecified: Secondary | ICD-10-CM | POA: Diagnosis not present

## 2022-02-13 DIAGNOSIS — M47816 Spondylosis without myelopathy or radiculopathy, lumbar region: Secondary | ICD-10-CM | POA: Diagnosis not present

## 2022-02-13 DIAGNOSIS — Z77018 Contact with and (suspected) exposure to other hazardous metals: Secondary | ICD-10-CM

## 2022-02-13 DIAGNOSIS — M5136 Other intervertebral disc degeneration, lumbar region: Secondary | ICD-10-CM | POA: Diagnosis not present

## 2022-02-13 DIAGNOSIS — M48061 Spinal stenosis, lumbar region without neurogenic claudication: Secondary | ICD-10-CM | POA: Diagnosis not present

## 2022-02-21 DIAGNOSIS — M48062 Spinal stenosis, lumbar region with neurogenic claudication: Secondary | ICD-10-CM | POA: Diagnosis not present

## 2022-02-21 DIAGNOSIS — M4726 Other spondylosis with radiculopathy, lumbar region: Secondary | ICD-10-CM | POA: Diagnosis not present

## 2022-03-06 ENCOUNTER — Other Ambulatory Visit: Payer: Self-pay | Admitting: Neurosurgery

## 2022-03-12 ENCOUNTER — Other Ambulatory Visit: Payer: Self-pay

## 2022-03-12 ENCOUNTER — Encounter (HOSPITAL_COMMUNITY): Payer: Self-pay

## 2022-03-12 ENCOUNTER — Encounter (HOSPITAL_COMMUNITY)
Admission: RE | Admit: 2022-03-12 | Discharge: 2022-03-12 | Disposition: A | Discharge status: Home / Self Care | Payer: PPO | Source: Ambulatory Visit | Attending: Neurological Surgery | Admitting: Neurological Surgery

## 2022-03-12 VITALS — BP 161/84 | HR 55 | Temp 97.6°F | Resp 19 | Ht 70.0 in | Wt 257.0 lb

## 2022-03-12 DIAGNOSIS — Z01818 Encounter for other preprocedural examination: Secondary | ICD-10-CM | POA: Diagnosis not present

## 2022-03-12 DIAGNOSIS — I2510 Atherosclerotic heart disease of native coronary artery without angina pectoris: Secondary | ICD-10-CM | POA: Diagnosis not present

## 2022-03-12 HISTORY — DX: Unspecified osteoarthritis, unspecified site: M19.90

## 2022-03-12 HISTORY — DX: Prediabetes: R73.03

## 2022-03-12 LAB — BASIC METABOLIC PANEL
Anion gap: 7 (ref 5–15)
BUN: 19 mg/dL (ref 8–23)
CO2: 28 mmol/L (ref 22–32)
Calcium: 8.9 mg/dL (ref 8.9–10.3)
Chloride: 102 mmol/L (ref 98–111)
Creatinine, Ser: 1.19 mg/dL (ref 0.61–1.24)
GFR, Estimated: >60 mL/min (ref >60)
Glucose, Bld: 135 mg/dL — ABNORMAL HIGH (ref 70–99)
Potassium: 3.6 mmol/L (ref 3.5–5.1)
Sodium: 137 mmol/L (ref 135–145)

## 2022-03-12 LAB — TYPE AND SCREEN
ABO/RH(D): A POS
Antibody Screen: NEGATIVE

## 2022-03-12 LAB — SURGICAL PCR SCREEN
MRSA, PCR: NEGATIVE
Staphylococcus aureus: NEGATIVE

## 2022-03-12 LAB — CBC
HCT: 41.2 % (ref 39.0–52.0)
Hemoglobin: 13.6 g/dL (ref 13.0–17.0)
MCH: 30.8 pg (ref 26.0–34.0)
MCHC: 33 g/dL (ref 30.0–36.0)
MCV: 93.2 fL (ref 80.0–100.0)
Platelets: 196 10*3/uL (ref 150–400)
RBC: 4.42 MIL/uL (ref 4.22–5.81)
RDW: 12.5 % (ref 11.5–15.5)
WBC: 5.5 10*3/uL (ref 4.0–10.5)
nRBC: 0 % (ref 0.0–0.2)

## 2022-03-12 NOTE — Progress Notes (Signed)
   03/12/22 0801  OBSTRUCTIVE SLEEP APNEA  Have you ever been diagnosed with sleep apnea through a sleep study? No  Do you snore loudly (loud enough to be heard through closed doors)?  0  Do you often feel tired, fatigued, or sleepy during the daytime (such as falling asleep during driving or talking to someone)? 0  Has anyone observed you stop breathing during your sleep? 0  Do you have, or are you being treated for high blood pressure? 1  BMI more than 35 kg/m2? 1  Age > 50 (1-yes) 1  Neck circumference greater than:Male 16 inches or larger, Male 17inches or larger? 1  Male Gender (Yes=1) 1  Obstructive Sleep Apnea Score 5  Score 5 or greater  Results sent to PCP

## 2022-03-12 NOTE — Pre-Procedure Instructions (Signed)
Surgical Instructions    Your procedure is scheduled on Thursday, March 7.  Report to [facility] Main Entrance "A" at 5:30 A.M., then check in with the Admitting office.  Call this number if you have problems the morning of surgery:  [phone]   If you have any questions prior to your surgery date call [phone]: Open Monday-Friday 8am-4pm If you experience any cold or flu symptoms such as cough, fever, chills, shortness of breath, etc. between now and your scheduled surgery, please notify us at the above number     Remember:  Do not eat after midnight the night before your surgery  You may drink clear liquids until 4:30AM the morning of your surgery.   Clear liquids allowed are: Water, Non-Citrus Juices (without pulp), Carbonated Beverages, Clear Tea, Black Coffee ONLY (NO MILK, CREAM OR POWDERED CREAMER of any kind), and Gatorade    Take these medicines the morning of surgery with A SIP OF WATER:  atenolol (TENORMIN)  pregabalin (LYRICA)    As of today, STOP taking any Aspirin (unless otherwise instructed by your surgeon) Aleve, Naproxen, Ibuprofen, Motrin, Advil, Goody's, BC's, all herbal medications, fish oil, and all vitamins.   [facility] is not responsible for any belongings or valuables.    Do NOT Smoke (Tobacco/Vaping)  24 hours prior to your procedure  If you use a CPAP at night, you may bring your mask for your overnight stay.   Contacts, glasses, hearing aids, dentures or partials may not be worn into surgery, please bring cases for these belongings   For patients admitted to the hospital, discharge time will be determined by your treatment team.   Patients discharged the day of surgery will not be allowed to drive home, and someone needs to stay with them for 24 hours.   SURGICAL WAITING ROOM VISITATION Patients having surgery or a procedure may have no more than 2 support people in the waiting area - these visitors may rotate.   Children under the age of  23 must have an adult with them who is not the patient. If the patient needs to stay at the hospital during part of their recovery, the visitor guidelines for inpatient rooms apply. Pre-op nurse will coordinate an appropriate time for 1 support person to accompany patient in pre-op.  This support person may not rotate.   Please refer to [email or web address] for the visitor guidelines for Inpatients (after your surgery is over and you are in a regular room).    Special instructions:    Oral Hygiene is also important to reduce your risk of infection.  Remember - BRUSH YOUR TEETH THE MORNING OF SURGERY WITH YOUR REGULAR TOOTHPASTE   [facility]- Preparing For Surgery  Before surgery, you can play an important role. Because skin is not sterile, your skin needs to be as free of germs as possible. You can reduce the number of germs on your skin by washing with CHG (chlorahexidine gluconate) Soap before surgery.  CHG is an antiseptic cleaner which kills germs and bonds with the skin to continue killing germs even after washing.     Please do not use if you have an allergy to CHG or antibacterial soaps. If your skin becomes reddened/irritated stop using the CHG.  Do not shave (including legs and underarms) for at least 48 hours prior to first CHG shower. It is OK to shave your face.  Please follow these instructions carefully.     Shower the NIGHT BEFORE SURGERY and the [facility]  OF SURGERY with CHG Soap.   If you chose to wash your hair, wash your hair first as usual with your normal shampoo. After you shampoo, rinse your hair and body thoroughly to remove the shampoo.  Then [employer] and genitals (private parts) with your normal soap and rinse thoroughly to remove soap.  After that Use CHG Soap as you would any other liquid soap. You can apply CHG directly to the skin and wash gently with a scrungie or a clean washcloth.   Apply the CHG Soap to  your body ONLY FROM THE NECK DOWN.  Do not use on open wounds or open sores. Avoid contact with your eyes, ears, mouth and genitals (private parts). Wash Face and genitals (private parts)  with your normal soap.   Wash thoroughly, paying special attention to the area where your surgery will be performed.  Thoroughly rinse your body with warm water from the neck down.  DO NOT shower/wash with your normal soap after using and rinsing off the CHG Soap.  Pat yourself dry with a CLEAN TOWEL.  Wear CLEAN PAJAMAS to bed the night before surgery  Place CLEAN SHEETS on your bed the night before your surgery  DO NOT SLEEP WITH PETS.   Day of Surgery:  Take a shower with CHG soap. Wear Clean/Comfortable clothing the morning of surgery Do not wear jewelry or makeup. Do not wear lotions, powders, perfumes/cologne or deodorant. Do not shave 48 hours prior to surgery.  Men may shave face and neck. Do not bring valuables to the hospital. Do not wear nail polish, gel polish, artificial nails, or any other type of covering on natural nails (fingers and toes) If you have artificial nails or gel coating that need to be removed by a nail salon, please have this removed prior to surgery. Artificial nails or gel coating may interfere with anesthesia's ability to adequately monitor your vital signs.  Remember to brush your teeth WITH YOUR REGULAR TOOTHPASTE.    If you received a COVID test during your pre-op visit, it is requested that you wear a mask when out in public, stay away from anyone that may not be feeling well, and notify your surgeon if you develop symptoms. If you have been in contact with anyone that has tested positive in the last 10 days, please notify your surgeon.    Please read over the following fact sheets that you were given.

## 2022-03-12 NOTE — Progress Notes (Signed)
PCP -   Gaynelle Arabian, MD   Cardiologist - denies  PPM/ICD - denies  Chest x-ray - N/A EKG - 03/12/2022  Stress Test - denies ECHO - denies Cardiac Cath - denies  Sleep Study - denies- stop bang score 5- routed to PCP  Fasting Blood Sugar - N/A- pt states he was told to limit his carbs, A1c 6.1 on 12/18/21. Pt states he may be pre-DM.  Last dose of GLP1 agonist-  N/A  Blood Thinner Instructions: N/A Aspirin Instructions:N/A  ERAS Protcol - ERAS per order, no drink   COVID TEST- N/A   Anesthesia review: no  Patient denies shortness of breath, fever, cough and chest pain at PAT appointment   All instructions explained to the patient, with a verbal understanding of the material. Patient agrees to go over the instructions while at home for a better understanding. Patient also instructed to self quarantine after being tested for COVID-19. The opportunity to ask questions was provided.

## 2022-03-15 ENCOUNTER — Ambulatory Visit (HOSPITAL_COMMUNITY): Payer: PPO | Admitting: Anesthesiology

## 2022-03-15 ENCOUNTER — Ambulatory Visit (HOSPITAL_COMMUNITY): Payer: PPO

## 2022-03-15 ENCOUNTER — Encounter (HOSPITAL_COMMUNITY): Payer: Self-pay | Admitting: Neurological Surgery

## 2022-03-15 ENCOUNTER — Other Ambulatory Visit: Payer: Self-pay

## 2022-03-15 ENCOUNTER — Observation Stay (HOSPITAL_COMMUNITY)
Admission: RE | Admit: 2022-03-15 | Discharge: 2022-03-16 | Disposition: A | Discharge status: Home / Self Care | Payer: PPO | Attending: Neurological Surgery | Admitting: Neurological Surgery

## 2022-03-15 ENCOUNTER — Ambulatory Visit (HOSPITAL_BASED_OUTPATIENT_CLINIC_OR_DEPARTMENT_OTHER): Payer: PPO | Admitting: Anesthesiology

## 2022-03-15 ENCOUNTER — Encounter (HOSPITAL_COMMUNITY)
Admission: RE | Disposition: A | Discharge status: Home / Self Care | Payer: Self-pay | Source: Home / Self Care | Attending: Neurological Surgery

## 2022-03-15 DIAGNOSIS — M47812 Spondylosis without myelopathy or radiculopathy, cervical region: Secondary | ICD-10-CM | POA: Diagnosis not present

## 2022-03-15 DIAGNOSIS — M47816 Spondylosis without myelopathy or radiculopathy, lumbar region: Secondary | ICD-10-CM | POA: Diagnosis present

## 2022-03-15 DIAGNOSIS — I10 Essential (primary) hypertension: Secondary | ICD-10-CM | POA: Diagnosis not present

## 2022-03-15 DIAGNOSIS — M48062 Spinal stenosis, lumbar region with neurogenic claudication: Secondary | ICD-10-CM | POA: Diagnosis not present

## 2022-03-15 DIAGNOSIS — M5416 Radiculopathy, lumbar region: Secondary | ICD-10-CM | POA: Insufficient documentation

## 2022-03-15 HISTORY — PX: TRANSFORAMINAL LUMBAR INTERBODY FUSION (TLIF) WITH PEDICLE SCREW FIXATION 1 LEVEL: SHX6141

## 2022-03-15 LAB — ABO/RH: ABO/RH(D): A POS

## 2022-03-15 SURGERY — TRANSFORAMINAL LUMBAR INTERBODY FUSION (TLIF) WITH PEDICLE SCREW FIXATION 1 LEVEL
Anesthesia: General

## 2022-03-15 MED ORDER — SODIUM CHLORIDE 0.9% FLUSH: 3.0000 mL | Freq: Two times a day (BID) | INTRAVENOUS | Status: DC

## 2022-03-15 MED ORDER — PHENYLEPHRINE 80 MCG/ML (10ML) SYRINGE FOR IV PUSH (FOR BLOOD PRESSURE SUPPORT)
PREFILLED_SYRINGE | INTRAVENOUS | Status: DC | PRN
  Administered 2022-03-15: 80 ug via INTRAVENOUS

## 2022-03-15 MED ORDER — ACETAMINOPHEN 325 MG PO TABS
650.0000 mg | ORAL_TABLET | ORAL | Status: DC | PRN
  Administered 2022-03-15 – 2022-03-16 (×3): 650 mg via ORAL
  Filled 2022-03-15 (×3): qty 2

## 2022-03-15 MED ORDER — ACETAMINOPHEN 500 MG PO TABS
ORAL_TABLET | ORAL | Status: AC
  Administered 2022-03-15: 1000 mg via ORAL
  Filled 2022-03-15: qty 2

## 2022-03-15 MED ORDER — FENTANYL CITRATE (PF) 100 MCG/2ML IJ SOLN
INTRAMUSCULAR | Status: AC
  Filled 2022-03-15: qty 2

## 2022-03-15 MED ORDER — PROPOFOL 10 MG/ML IV BOLUS
INTRAVENOUS | Status: DC | PRN
  Administered 2022-03-15: 140 mg via INTRAVENOUS

## 2022-03-15 MED ORDER — AMISULPRIDE (ANTIEMETIC) 5 MG/2ML IV SOLN: 10.0000 mg | Freq: Once | INTRAVENOUS | Status: DC | PRN

## 2022-03-15 MED ORDER — DEXAMETHASONE SODIUM PHOSPHATE 10 MG/ML IJ SOLN
INTRAMUSCULAR | Status: DC | PRN
  Administered 2022-03-15: 5 mg via INTRAVENOUS

## 2022-03-15 MED ORDER — PHENYLEPHRINE HCL-NACL 20-0.9 MG/250ML-% IV SOLN
INTRAVENOUS | Status: DC | PRN
  Administered 2022-03-15: 25 ug/min via INTRAVENOUS

## 2022-03-15 MED ORDER — HYDROMORPHONE HCL 1 MG/ML IJ SOLN: 1.0000 mg | INTRAMUSCULAR | Status: DC | PRN

## 2022-03-15 MED ORDER — OXYCODONE HCL 5 MG PO TABS
ORAL_TABLET | ORAL | Status: AC
  Filled 2022-03-15: qty 1

## 2022-03-15 MED ORDER — MIDAZOLAM HCL 2 MG/2ML IJ SOLN
INTRAMUSCULAR | Status: DC | PRN
  Administered 2022-03-15: 2 mg via INTRAVENOUS

## 2022-03-15 MED ORDER — THROMBIN 5000 UNITS EX SOLR
OROMUCOSAL | Status: DC | PRN
  Administered 2022-03-15: 5 mL via TOPICAL

## 2022-03-15 MED ORDER — PROPOFOL 10 MG/ML IV BOLUS
INTRAVENOUS | Status: AC
  Filled 2022-03-15: qty 20

## 2022-03-15 MED ORDER — FENTANYL CITRATE (PF) 100 MCG/2ML IJ SOLN
25.0000 ug | INTRAMUSCULAR | Status: DC | PRN
  Administered 2022-03-15: 25 ug via INTRAVENOUS
  Administered 2022-03-15: 50 ug via INTRAVENOUS
  Administered 2022-03-15: 25 ug via INTRAVENOUS

## 2022-03-15 MED ORDER — CHLORHEXIDINE GLUCONATE CLOTH 2 % EX PADS: 6.0000 | MEDICATED_PAD | Freq: Once | CUTANEOUS | Status: DC

## 2022-03-15 MED ORDER — ACETAMINOPHEN 650 MG RE SUPP: 650.0000 mg | RECTAL | Status: DC | PRN

## 2022-03-15 MED ORDER — ONDANSETRON HCL 4 MG/2ML IJ SOLN: 4.0000 mg | Freq: Four times a day (QID) | INTRAMUSCULAR | Status: DC | PRN

## 2022-03-15 MED ORDER — LIDOCAINE-EPINEPHRINE 1 %-1:100000 IJ SOLN
INTRAMUSCULAR | Status: DC | PRN
  Administered 2022-03-15: 10 mL

## 2022-03-15 MED ORDER — OXYCODONE HCL 5 MG PO TABS
10.0000 mg | ORAL_TABLET | ORAL | Status: DC | PRN
  Administered 2022-03-15 – 2022-03-16 (×4): 10 mg via ORAL
  Filled 2022-03-15 (×4): qty 2

## 2022-03-15 MED ORDER — LISINOPRIL 10 MG PO TABS
10.0000 mg | ORAL_TABLET | Freq: Every day | ORAL | Status: DC
  Administered 2022-03-15 – 2022-03-16 (×2): 10 mg via ORAL
  Filled 2022-03-15 (×3): qty 1

## 2022-03-15 MED ORDER — THROMBIN 5000 UNITS EX SOLR
CUTANEOUS | Status: AC
  Filled 2022-03-15: qty 5000

## 2022-03-15 MED ORDER — ROCURONIUM BROMIDE 10 MG/ML (PF) SYRINGE
PREFILLED_SYRINGE | INTRAVENOUS | Status: DC | PRN
  Administered 2022-03-15: 30 mg via INTRAVENOUS
  Administered 2022-03-15: 70 mg via INTRAVENOUS

## 2022-03-15 MED ORDER — PREGABALIN 75 MG PO CAPS
75.0000 mg | ORAL_CAPSULE | Freq: Two times a day (BID) | ORAL | Status: DC
  Administered 2022-03-15 – 2022-03-16 (×2): 75 mg via ORAL
  Filled 2022-03-15 (×2): qty 1

## 2022-03-15 MED ORDER — CEFAZOLIN SODIUM-DEXTROSE 2-4 GM/100ML-% IV SOLN
INTRAVENOUS | Status: AC
  Filled 2022-03-15: qty 100

## 2022-03-15 MED ORDER — 0.9 % SODIUM CHLORIDE (POUR BTL) OPTIME
TOPICAL | Status: DC | PRN
  Administered 2022-03-15: 1000 mL

## 2022-03-15 MED ORDER — CHLORHEXIDINE GLUCONATE 0.12 % MT SOLN
OROMUCOSAL | Status: AC
  Administered 2022-03-15: 15 mL via OROMUCOSAL
  Filled 2022-03-15: qty 15

## 2022-03-15 MED ORDER — CEFAZOLIN SODIUM-DEXTROSE 2-4 GM/100ML-% IV SOLN
2.0000 g | INTRAVENOUS | Status: AC
  Administered 2022-03-15: 2 g via INTRAVENOUS

## 2022-03-15 MED ORDER — TRAMADOL HCL 50 MG PO TABS
50.0000 mg | ORAL_TABLET | Freq: Four times a day (QID) | ORAL | Status: DC | PRN
  Administered 2022-03-15: 50 mg via ORAL
  Filled 2022-03-15: qty 1

## 2022-03-15 MED ORDER — LACTATED RINGERS IV SOLN: INTRAVENOUS | Status: DC

## 2022-03-15 MED ORDER — CHLORHEXIDINE GLUCONATE 0.12 % MT SOLN: 15.0000 mL | Freq: Once | OROMUCOSAL | Status: AC

## 2022-03-15 MED ORDER — ACETAMINOPHEN 500 MG PO TABS: 1000.0000 mg | ORAL_TABLET | Freq: Once | ORAL | Status: AC

## 2022-03-15 MED ORDER — ONDANSETRON HCL 4 MG PO TABS: 4.0000 mg | ORAL_TABLET | Freq: Four times a day (QID) | ORAL | Status: DC | PRN

## 2022-03-15 MED ORDER — SODIUM CHLORIDE 0.9 % IV SOLN
250.0000 mL | INTRAVENOUS | Status: DC
  Administered 2022-03-15: 250 mL via INTRAVENOUS

## 2022-03-15 MED ORDER — FENTANYL CITRATE (PF) 250 MCG/5ML IJ SOLN
INTRAMUSCULAR | Status: DC | PRN
  Administered 2022-03-15: 100 ug via INTRAVENOUS
  Administered 2022-03-15: 50 ug via INTRAVENOUS
  Administered 2022-03-15: 100 ug via INTRAVENOUS

## 2022-03-15 MED ORDER — CYCLOBENZAPRINE HCL 10 MG PO TABS
10.0000 mg | ORAL_TABLET | Freq: Three times a day (TID) | ORAL | Status: DC | PRN
  Administered 2022-03-15 – 2022-03-16 (×2): 10 mg via ORAL
  Filled 2022-03-15 (×2): qty 1

## 2022-03-15 MED ORDER — FENTANYL CITRATE (PF) 250 MCG/5ML IJ SOLN
INTRAMUSCULAR | Status: AC
  Filled 2022-03-15: qty 5

## 2022-03-15 MED ORDER — DOCUSATE SODIUM 100 MG PO CAPS
100.0000 mg | ORAL_CAPSULE | Freq: Two times a day (BID) | ORAL | Status: DC
  Administered 2022-03-15 – 2022-03-16 (×3): 100 mg via ORAL
  Filled 2022-03-15 (×3): qty 1

## 2022-03-15 MED ORDER — MENTHOL 3 MG MT LOZG: 1.0000 | LOZENGE | OROMUCOSAL | Status: DC | PRN

## 2022-03-15 MED ORDER — PHENOL 1.4 % MT LIQD: 1.0000 | OROMUCOSAL | Status: DC | PRN

## 2022-03-15 MED ORDER — ORAL CARE MOUTH RINSE: 15.0000 mL | Freq: Once | OROMUCOSAL | Status: AC

## 2022-03-15 MED ORDER — BUPIVACAINE HCL (PF) 0.25 % IJ SOLN
INTRAMUSCULAR | Status: AC
  Filled 2022-03-15: qty 30

## 2022-03-15 MED ORDER — SODIUM CHLORIDE 0.9% FLUSH: 3.0000 mL | INTRAVENOUS | Status: DC | PRN

## 2022-03-15 MED ORDER — HYDROCHLOROTHIAZIDE 25 MG PO TABS
25.0000 mg | ORAL_TABLET | Freq: Every day | ORAL | Status: DC
  Administered 2022-03-15 – 2022-03-16 (×2): 25 mg via ORAL
  Filled 2022-03-15 (×2): qty 1

## 2022-03-15 MED ORDER — MIDAZOLAM HCL 2 MG/2ML IJ SOLN
INTRAMUSCULAR | Status: AC
  Filled 2022-03-15: qty 2

## 2022-03-15 MED ORDER — SUGAMMADEX SODIUM 200 MG/2ML IV SOLN
INTRAVENOUS | Status: DC | PRN
  Administered 2022-03-15: 300 mg via INTRAVENOUS

## 2022-03-15 MED ORDER — CEFAZOLIN SODIUM-DEXTROSE 2-4 GM/100ML-% IV SOLN
2.0000 g | Freq: Three times a day (TID) | INTRAVENOUS | Status: DC
  Administered 2022-03-15: 2 g via INTRAVENOUS
  Filled 2022-03-15: qty 100

## 2022-03-15 MED ORDER — ONDANSETRON HCL 4 MG/2ML IJ SOLN
INTRAMUSCULAR | Status: DC | PRN
  Administered 2022-03-15: 4 mg via INTRAVENOUS

## 2022-03-15 MED ORDER — ATENOLOL 50 MG PO TABS
50.0000 mg | ORAL_TABLET | Freq: Every day | ORAL | Status: DC
  Administered 2022-03-16: 50 mg via ORAL
  Filled 2022-03-15: qty 1

## 2022-03-15 MED ORDER — LIDOCAINE-EPINEPHRINE 1 %-1:100000 IJ SOLN
INTRAMUSCULAR | Status: AC
  Filled 2022-03-15: qty 1

## 2022-03-15 MED ORDER — TAMSULOSIN HCL 0.4 MG PO CAPS
0.4000 mg | ORAL_CAPSULE | Freq: Every day | ORAL | Status: DC
  Administered 2022-03-15 – 2022-03-16 (×2): 0.4 mg via ORAL
  Filled 2022-03-15 (×2): qty 1

## 2022-03-15 MED ORDER — LIDOCAINE 2% (20 MG/ML) 5 ML SYRINGE
INTRAMUSCULAR | Status: DC | PRN
  Administered 2022-03-15: 30 mg via INTRAVENOUS

## 2022-03-15 MED ORDER — OXYCODONE HCL 5 MG PO TABS: 10.0000 mg | ORAL_TABLET | ORAL | Status: DC | PRN

## 2022-03-15 MED ORDER — POLYETHYLENE GLYCOL 3350 17 G PO PACK: 17.0000 g | PACK | Freq: Every day | ORAL | Status: DC | PRN

## 2022-03-15 MED ORDER — OXYCODONE HCL 5 MG PO TABS: 5.0000 mg | ORAL_TABLET | ORAL | Status: DC | PRN

## 2022-03-15 SURGICAL SUPPLY — 84 items
ADH SKN CLS APL DERMABOND .7 (GAUZE/BANDAGES/DRESSINGS) ×1
ADH SKN CLS LQ APL DERMABOND (GAUZE/BANDAGES/DRESSINGS) ×1
APL SKNCLS STERI-STRIP NONHPOA (GAUZE/BANDAGES/DRESSINGS)
BAG COUNTER SPONGE SURGICOUNT (BAG) ×2 IMPLANT
BAG SPNG CNTER NS LX DISP (BAG) ×2
BAND INSRT 18 STRL LF DISP RB (MISCELLANEOUS)
BAND RUBBER #18 3X1/16 STRL (MISCELLANEOUS) ×4 IMPLANT
BASKET BONE COLLECTION (BASKET) ×2 IMPLANT
BENZOIN TINCTURE PRP APPL 2/3 (GAUZE/BANDAGES/DRESSINGS) IMPLANT
BLADE CLIPPER SURG (BLADE) IMPLANT
BLADE SURG 11 STRL SS (BLADE) ×2 IMPLANT
BUR 14 MATCH 3 (BUR) IMPLANT
BUR MATCHSTICK NEURO 3.0 LAGG (BURR) ×2 IMPLANT
BUR MR8 14CM BALL SYMTRI 5 (BUR) IMPLANT
BUR PRECISION FLUTE 5.0 (BURR) ×2 IMPLANT
BURR 14 MATCH 3 (BUR) ×1
BURR MR8 14CM BALL SYMTRI 5 (BUR) ×1
CANISTER SUCT 3000ML PPV (MISCELLANEOUS) ×2 IMPLANT
CNTNR URN SCR LID CUP LEK RST (MISCELLANEOUS) ×2 IMPLANT
CONT SPEC 4OZ STRL OR WHT (MISCELLANEOUS) ×1
COVER BACK TABLE 60X90IN (DRAPES) ×2 IMPLANT
COVERAGE SUPPORT O-ARM STEALTH (MISCELLANEOUS) ×1 IMPLANT
DERMABOND ADVANCED .7 DNX12 (GAUZE/BANDAGES/DRESSINGS) ×2 IMPLANT
DERMABOND ADVANCED .7 DNX6 (GAUZE/BANDAGES/DRESSINGS) IMPLANT
DRAPE C-ARM 42X72 X-RAY (DRAPES) IMPLANT
DRAPE C-ARMOR (DRAPES) IMPLANT
DRAPE LAPAROTOMY 100X72X124 (DRAPES) ×2 IMPLANT
DRAPE MICROSCOPE SLANT 54X150 (MISCELLANEOUS) ×2 IMPLANT
DRAPE SHEET LG 3/4 BI-LAMINATE (DRAPES) ×8 IMPLANT
DRAPE SURG 17X23 STRL (DRAPES) ×2 IMPLANT
DURAPREP 26ML APPLICATOR (WOUND CARE) ×2 IMPLANT
ELECT BLADE 4.0 EZ CLEAN MEGAD (MISCELLANEOUS) ×1
ELECT REM PT RETURN 9FT ADLT (ELECTROSURGICAL) ×1
ELECTRODE BLDE 4.0 EZ CLN MEGD (MISCELLANEOUS) IMPLANT
ELECTRODE REM PT RTRN 9FT ADLT (ELECTROSURGICAL) ×2 IMPLANT
FEE COVERAGE SUPPORT O-ARM (MISCELLANEOUS) ×2 IMPLANT
GAUZE 4X4 16PLY ~~LOC~~+RFID DBL (SPONGE) IMPLANT
GAUZE SPONGE 4X4 12PLY STRL (GAUZE/BANDAGES/DRESSINGS) IMPLANT
GLOVE BIOGEL PI IND STRL 7.5 (GLOVE) ×4 IMPLANT
GLOVE ECLIPSE 7.5 STRL STRAW (GLOVE) ×4 IMPLANT
GLOVE EXAM NITRILE LRG STRL (GLOVE) IMPLANT
GLOVE EXAM NITRILE XL STR (GLOVE) IMPLANT
GLOVE EXAM NITRILE XS STR PU (GLOVE) IMPLANT
GLOVE SURG SS PI 6.5 STRL IVOR (GLOVE) IMPLANT
GLOVE SURG SS PI 7.0 STRL IVOR (GLOVE) IMPLANT
GOWN STRL REUS W/ TWL LRG LVL3 (GOWN DISPOSABLE) ×8 IMPLANT
GOWN STRL REUS W/ TWL XL LVL3 (GOWN DISPOSABLE) IMPLANT
GOWN STRL REUS W/TWL 2XL LVL3 (GOWN DISPOSABLE) IMPLANT
GOWN STRL REUS W/TWL LRG LVL3 (GOWN DISPOSABLE) ×4
GOWN STRL REUS W/TWL XL LVL3 (GOWN DISPOSABLE) ×2
HEMOSTAT POWDER KIT SURGIFOAM (HEMOSTASIS) ×2 IMPLANT
KIT BASIN OR (CUSTOM PROCEDURE TRAY) ×2 IMPLANT
KIT INFUSE SMALL (Orthopedic Implant) IMPLANT
KIT POSITION SURG JACKSON T1 (MISCELLANEOUS) ×2 IMPLANT
KIT TURNOVER KIT B (KITS) ×2 IMPLANT
MARKER SPHERE PSV REFLC NDI (MISCELLANEOUS) ×10 IMPLANT
MILL BONE PREP (MISCELLANEOUS) IMPLANT
NDL HYPO 18GX1.5 BLUNT FILL (NEEDLE) IMPLANT
NDL SPNL 18GX3.5 QUINCKE PK (NEEDLE) IMPLANT
NEEDLE HYPO 18GX1.5 BLUNT FILL (NEEDLE) IMPLANT
NEEDLE HYPO 22GX1.5 SAFETY (NEEDLE) ×2 IMPLANT
NEEDLE SPNL 18GX3.5 QUINCKE PK (NEEDLE) ×1 IMPLANT
NS IRRIG 1000ML POUR BTL (IV SOLUTION) ×2 IMPLANT
PACK LAMINECTOMY NEURO (CUSTOM PROCEDURE TRAY) ×2 IMPLANT
PAD ARMBOARD 7.5X6 YLW CONV (MISCELLANEOUS) ×6 IMPLANT
ROD SPINAL 5.5X35 CP 4 TI (Rod) IMPLANT
SCREW MAS/SET STERILE 4PK (Screw) IMPLANT
SCREW SHANK NL TITAN 7.5X40 (Screw) IMPLANT
SCREW SHANK NL TITAN 7.5X45 (Screw) IMPLANT
SPACER PL CATALYFT LONG 11 (Spacer) IMPLANT
SPIKE FLUID TRANSFER (MISCELLANEOUS) ×2 IMPLANT
SPONGE SURGIFOAM ABS GEL 100 (HEMOSTASIS) IMPLANT
SPONGE T-LAP 4X18 ~~LOC~~+RFID (SPONGE) IMPLANT
STRIP CLOSURE SKIN 1/2X4 (GAUZE/BANDAGES/DRESSINGS) IMPLANT
SUT MNCRL AB 3-0 PS2 18 (SUTURE) ×2 IMPLANT
SUT VIC AB 0 CT1 18XCR BRD8 (SUTURE) ×2 IMPLANT
SUT VIC AB 0 CT1 8-18 (SUTURE) ×1
SUT VIC AB 2-0 CP2 18 (SUTURE) ×2 IMPLANT
SYR 3ML LL SCALE MARK (SYRINGE) IMPLANT
TOWEL GREEN STERILE (TOWEL DISPOSABLE) ×2 IMPLANT
TOWEL GREEN STERILE FF (TOWEL DISPOSABLE) ×2 IMPLANT
TRAY FOLEY MTR SLVR 14FR STAT (SET/KITS/TRAYS/PACK) IMPLANT
TRAY FOLEY MTR SLVR 16FR STAT (SET/KITS/TRAYS/PACK) ×2 IMPLANT
WATER STERILE IRR 1000ML POUR (IV SOLUTION) ×2 IMPLANT

## 2022-03-15 NOTE — Anesthesia Preprocedure Evaluation (Signed)
Anesthesia Evaluation  Patient identified by MRN, date of birth, ID band Patient awake    Reviewed: Allergy & Precautions, NPO status , Patient's Chart, lab work & pertinent test results  Airway Mallampati: II  TM Distance: >3 FB Neck ROM: Full    Dental  (+) Dental Advisory Given   Pulmonary neg pulmonary ROS   breath sounds clear to auscultation       Cardiovascular hypertension, Pt. on medications  Rhythm:Regular Rate:Normal     Neuro/Psych negative neurological ROS     GI/Hepatic negative GI ROS, Neg liver ROS,,,  Endo/Other  negative endocrine ROS    Renal/GU negative Renal ROS     Musculoskeletal  (+) Arthritis ,    Abdominal   Peds  Hematology negative hematology ROS (+)   Anesthesia Other Findings   Reproductive/Obstetrics                             Anesthesia Physical Anesthesia Plan  ASA: 2  Anesthesia Plan: General   Post-op Pain Management: Tylenol PO (pre-op)*   Induction: Intravenous  PONV Risk Score and Plan: 2 and Dexamethasone, Ondansetron and Treatment may vary due to age or medical condition  Airway Management Planned: Oral ETT  Additional Equipment: None  Intra-op Plan:   Post-operative Plan: Extubation in OR  Informed Consent: I have reviewed the patients History and Physical, chart, labs and discussed the procedure including the risks, benefits and alternatives for the proposed anesthesia with the patient or authorized representative who has indicated his/her understanding and acceptance.       Plan Discussed with:   Anesthesia Plan Comments:        Anesthesia Quick Evaluation

## 2022-03-15 NOTE — H&P (Signed)
Surgical H&P Update  HPI: 72 y.o. with a history of neurogenic claudication. Workup showed central canal stenosis with foraminal stenosis at L4-5. No changes in health since they were last seen. Still having the above and wishes to proceed with surgery.  PMHx:  Past Medical History:  Diagnosis Date   Arthritis    Hypertension    Pre-diabetes    FamHx: History reviewed. No pertinent family history. SocHx:  reports that he has never smoked. He has never used smokeless tobacco. He reports current alcohol use. He reports that he does not use drugs.  Physical Exam: Strength 5/5 x4 and SILTx4   Assesment/Plan: 72 y.o. man with neurogenic claudication, here for L4-5 open decompression and TLIF/PLF. Risks, benefits, and alternatives discussed and the patient would like to continue with surgery.  -OR today -3C post-op  Judith Part, MD 03/15/22 7:30 AM

## 2022-03-15 NOTE — Op Note (Signed)
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 The patient was found to have central canal and foraminal stenosis at L4-5. This was discussed with the patient as well as risks, benefits, and alternatives and wished to proceed with surgery.   OPERATIVE DETAIL: The patient was taken to the operating room and anesthesia was induced by the anesthesia team. They were placed on the OR table in the prone position with padding of all pressure points. A formal time out was performed with two patient identifiers and confirmed the operative site. The operative site was marked, hair was clipped with surgical clippers, the area was then prepped and draped in a sterile fashion. Fluoro was used to localize the operative level and a midline incision was placed to expose from L4 to L5. Subperiosteal dissection was performed bilaterally and fluoroscopy was again used to confirm the surgical level.   A spinous process clamp was applied and secured, followed by attachment of a reference array. The field was draped and the O-arm was brought into the field. An intra-op CT was performed, sent to the Stealth navigation station, registered to the patient's anatomy, and confirmed with landmarks with acceptable fit. Stereotactic spinal navigation was utilized throughout the procedure for planning and placement of pedicle screw  trajectories.  Instrumentation was then performed. Navigation was used to guide placement of bilateral pedicle screws ([employer]) at L4 and L5 bilaterally. These were placed with navigated instruments by localizing the pedicle, drilling a pilot hole, cannulating the pedicle with an awl-tap, palpating for pedicle wall breaches, and then placing the screw.   Decompression was performed, which consisted of an L4 decompressive laminectomy, more than what would be required for instrumentation alone. A left complete facetectomy was performed, the exiting root was identified and protected, and a discectomy was performed at L4-5 followed by endplate preparation, placement of autologous bone graft, and then an expandable titanium interbody device with backfill through the cage of additional autograft, all using navigated instruments.  The caps were then placed onto the pedicle screws and these were connected with rods bilaterally, compressed, and final tightened according to manufacturer torque specifications. The bone was thoroughly decorticated over the posterolateral fusion surfaces and the previously resected bone fragments were morselized and used as autograft with the   All instrument and sponge counts were correct, the incision was then closed in layers. The patient was then returned to anesthesia for emergence. No apparent complications at the completion of the procedure.   EBL:  167m   DRAINS: none   SPECIMENS: none   TJudith Part MD 03/15/22 7:31 AM

## 2022-03-15 NOTE — Transfer of Care (Signed)
Immediate Anesthesia Transfer of Care Note  Patient: David Tyler  Procedure(s) Performed: Open Lumbar four-five Laminectomy with Transforaminal Lumbar Interbody Fusion and posterior instrumentation Application of O-Arm  Patient Location: PACU  Anesthesia Type:General  Level of Consciousness: awake, alert , and oriented  Airway & Oxygen Therapy: Patient Spontanous Breathing  Post-op Assessment: Report given to RN and Post -op Vital signs reviewed and stable  Post vital signs: Reviewed and stable  Last Vitals:  Vitals Value Taken Time  BP 110/67 03/15/22 1100  Temp    Pulse 55 03/15/22 1107  Resp 15 03/15/22 1107  SpO2 94 % 03/15/22 1107  Vitals shown include unvalidated device data.  Last Pain:  Vitals:   03/15/22 0556  PainSc: 1          Complications: No notable events documented.

## 2022-03-15 NOTE — Anesthesia Procedure Notes (Signed)
Procedure Name: Intubation Date/Time: 03/15/2022 7:48 AM  Performed by: Eligha Bridegroom, CRNAPre-anesthesia Checklist: Patient identified, Emergency Drugs available, Suction available, Patient being monitored and Timeout performed Patient Re-evaluated:Patient Re-evaluated prior to induction Oxygen Delivery Method: Circle system utilized Preoxygenation: Pre-oxygenation with 100% oxygen Induction Type: IV induction Ventilation: Mask ventilation without difficulty Laryngoscope Size: Mac and 4 Grade View: Grade I Tube type: Oral Tube size: 8.0 mm Number of attempts: 1 Airway Equipment and Method: Stylet Placement Confirmation: ETT inserted through vocal cords under direct vision, positive ETCO2 and breath sounds checked- equal and bilateral Secured at: 22 cm Tube secured with: Tape Dental Injury: Teeth and Oropharynx as per pre-operative assessment

## 2022-03-16 ENCOUNTER — Encounter (HOSPITAL_COMMUNITY): Payer: Self-pay | Admitting: Neurological Surgery

## 2022-03-16 DIAGNOSIS — M48062 Spinal stenosis, lumbar region with neurogenic claudication: Secondary | ICD-10-CM | POA: Diagnosis not present

## 2022-03-16 MED ORDER — OXYCODONE HCL 10 MG PO TABS: 10.0000 mg | ORAL_TABLET | ORAL | 0 refills | Status: DC | PRN

## 2022-03-16 MED ORDER — CYCLOBENZAPRINE HCL 10 MG PO TABS: 10.0000 mg | ORAL_TABLET | Freq: Three times a day (TID) | ORAL | 0 refills | Status: DC | PRN

## 2022-03-16 MED ORDER — TAMSULOSIN HCL 0.4 MG PO CAPS: 0.4000 mg | ORAL_CAPSULE | Freq: Every day | ORAL | 0 refills | Status: AC

## 2022-03-16 NOTE — Anesthesia Postprocedure Evaluation (Signed)
Anesthesia Post Note  Patient: David Tyler  Procedure(s) Performed: Open Lumbar four-five Laminectomy with Transforaminal Lumbar Interbody Fusion and posterior instrumentation Application of O-Arm     Patient location during evaluation: PACU Anesthesia Type: General Level of consciousness: awake and alert Pain management: pain level controlled Vital Signs Assessment: post-procedure vital signs reviewed and stable Respiratory status: spontaneous breathing, nonlabored ventilation, respiratory function stable and patient connected to nasal cannula oxygen Cardiovascular status: blood pressure returned to baseline and stable Postop Assessment: no apparent nausea or vomiting Anesthetic complications: no   No notable events documented.  Last Vitals:  Vitals:   03/16/22 0440 03/16/22 0723  BP: 106/64 111/67  Pulse: 77 91  Resp: 20 16  Temp: 37.1 C 36.7 C  SpO2: 97% 93%    Last Pain:  Vitals:   03/16/22 1031  TempSrc:   PainSc: 3    Pain Goal: Patients Stated Pain Goal: 2 (03/16/22 0539)                 Tiajuana Amass

## 2022-03-16 NOTE — Evaluation (Signed)
Occupational Therapy Evaluation Patient Details Name: David Tyler MRN: [id number] DOB: 04/27/1950 Today's Date: 03/16/2022   History of Present Illness Patient is a 72 y/o male who presents on 3/6 for L4-5 laminectomy and bilateral foraminotomies and left sided L4-5 TLIF, L4-5 PLIF. PMH includes HTN.   Clinical Impression   Patient evaluated by Occupational Therapy with no further acute OT needs identified. All education has been completed and the patient has no further questions. See below for any follow-up Occupational Therapy or equipment needs. OT to sign off. Thank you for referral.        Recommendations for follow up therapy are one component of a multi-disciplinary discharge planning process, led by the attending physician.  Recommendations may be updated based on patient status, additional functional criteria and insurance authorization.   Follow Up Recommendations  No OT follow up     Assistance Recommended at Discharge Set up Supervision/Assistance  Patient can return home with the following A little help with bathing/dressing/bathroom    Functional Status Assessment  Patient has had a recent decline in their functional status and demonstrates the ability to make significant improvements in function in a reasonable and predictable amount of time.  Equipment Recommendations  None recommended by OT    Recommendations for Other Services       Precautions / Restrictions Precautions Precautions: Back Precaution Comments: handout reviewed for adls and present in room      Mobility Bed Mobility Overal bed mobility: Modified Independent                  Transfers Overall transfer level: Modified independent                        Balance Overall balance assessment: Needs assistance Sitting-balance support: Bilateral upper extremity supported, Feet supported Sitting balance-Leahy Scale: Fair     Standing balance support: Single extremity  supported, During functional activity Standing balance-Leahy Scale: Fair                             ADL either performed or assessed with clinical judgement   ADL Overall ADL's : Needs assistance/impaired Eating/Feeding: Independent   Grooming: Wash/dry hands;Independent;Standing               Lower Body Dressing: Min guard;Cueing for back precautions;Sit to/from stand Lower Body Dressing Details (indicate cue type and reason): pt educated on use of reacher for task to prevent bending. pt is able to complete R LE without (A) but L LE required reacher as pt was bending. Toilet Transfer: Supervision/safety;Ambulation;Regular Glass blower/designer Details (indicate cue type and reason): pt attempting to stand with urinal to void bladder without success         Functional mobility during ADLs: Supervision/safety    Back handout provided and reviewed adls in detail. Pt educated on:  set an alarm at night for medication, avoid sitting for long periods of time, correct bed positioning for sleeping, correct sequence for bed mobility, avoiding lifting more than 5 pounds and never wash directly over incision. Pt required cues throughout session and reinforced back precautions importance and reason. All education is complete and patient indicates understanding.    Vision Patient Visual Report: No change from baseline       Perception     Praxis      Pertinent Vitals/Pain Pain Assessment Pain Assessment: 0-10 Pain Score: 4  Pain Location: back  Pain Descriptors / Indicators: Sore Pain Intervention(s): Monitored during session, Premedicated before session, Repositioned     Hand Dominance Right   Extremity/Trunk Assessment Upper Extremity Assessment Upper Extremity Assessment: Overall WFL for tasks assessed   Lower Extremity Assessment Lower Extremity Assessment: Defer to PT evaluation   Cervical / Trunk Assessment Cervical / Trunk Assessment: Back Surgery    Communication Communication Communication: No difficulties   Cognition Arousal/Alertness: Awake/alert Behavior During Therapy: WFL for tasks assessed/performed Overall Cognitive Status: Within Functional Limits for tasks assessed                                       General Comments  incision dry and intact at this time    Exercises     Shoulder Instructions      Home Living Family/patient expects to be discharged to:: Private residence Living Arrangements: Spouse/significant other Available Help at Discharge: Family;Available PRN/intermittently Type of Home: House Home Access: Stairs to enter [employer] of Steps: 1   Home Layout: One level     Bathroom Shower/Tub: Teacher, early years/pre: Standard     Home Equipment: Conservation officer, nature (2 wheels);Tub bench;Wheelchair - manual   Additional Comments: no animals      Prior Functioning/Environment Prior Level of Function : Independent/Modified Independent             Mobility Comments: Independent, works as a Building control surveyor, does all IADLs as wife in w/c, drives ADLs Comments: independent, fell last year in June resulting in back injury        OT Problem List:        OT Treatment/Interventions:      OT Goals(Current goals can be found in the care plan section) Acute Rehab OT Goals 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 to wife  OT Frequency:      Co-evaluation              AM-PAC OT "6 Clicks" Daily Activity     Outcome Measure Help from another person eating meals?: None Help from another person taking care of personal grooming?: None Help from another person toileting, which includes using toliet, bedpan, or urinal?: A Little Help from another person bathing (including washing, rinsing, drying)?: A Little Help from another person to put on and taking off regular upper body clothing?: A Little Help from another person to put on and taking off regular lower body clothing?: A  Little 6 Click Score: 20   End of Session Nurse Communication: Mobility status;Precautions  Activity Tolerance: Patient tolerated treatment well Patient left: in bed;with call bell/phone within reach  OT Visit Diagnosis: Unsteadiness on feet (R26.81)                Time: [id number] OT Time Calculation (min): 36 min Charges:  OT General Charges $OT Visit: 1 Visit OT Evaluation $OT Eval Moderate Complexity: 1 Mod OT Treatments $Self Care/Home Management : 8-22 mins   Brynn, OTR/L  Acute Rehabilitation Services Office: [phone] .   Jeri Modena 03/16/2022, 2:17 PM

## 2022-03-16 NOTE — Evaluation (Signed)
Physical Therapy Evaluation Patient Details Name: BOWYN NEVIL MRN: [id number] DOB: [birth year] Today's Date: 03/16/2022  History of Present Illness  Patient is a 72 y/o male who presents on 3/6 for L4-5 laminectomy and bilateral foraminotomies and left sided L4-5 TLIF, L4-5 PLIF. PMH includes HTN.  Clinical Impression  Patient presents with pain and post surgical deficits s/p above surgery. Pt reports being independent for ADLs/IADLs and working at baseline. Helps care for wife who is in a w/c. Today, pt tolerated transfers and ambulation supervision-MOd I for safety. Pain is biggest limiting factor with some difficulty donning shoes sitting EOB. Also reports complaints about not being able to urinate. Education re: back precaution, log roll technique, positioning, walking program, car transfer etc. Pt will have support from daughter and neighbor at home. Does not require further skilled therapy services. All education completed. Discharge from therapy.       Recommendations for follow up therapy are one component of a multi-disciplinary discharge planning process, led by the attending physician.  Recommendations may be updated based on patient status, additional functional criteria and insurance authorization.  Follow Up Recommendations No PT follow up      Assistance Recommended at Discharge PRN  Patient can return home with the following  Assistance with cooking/housework;Assist for transportation    Equipment Recommendations None recommended by PT  Recommendations for Other Services       Functional Status Assessment Patient has had a recent decline in their functional status and demonstrates the ability to make significant improvements in function in a reasonable and predictable amount of time.     Precautions / Restrictions Precautions Precautions: Back Precaution Booklet Issued: Yes (comment) Precaution Comments: Reviewed handout and precautions Restrictions Weight Bearing  Restrictions: No      Mobility  Bed Mobility Overal bed mobility: Needs Assistance Bed Mobility: Rolling, Sidelying to Sit Rolling: Supervision Sidelying to sit: Supervision, HOB elevated       General bed mobility comments: Cues for log roll technique, has adjustable bed at home.    Transfers Overall transfer level: Needs assistance Equipment used: None Transfers: Sit to/from Stand Sit to Stand: Supervision           General transfer comment: Supervision for safety. Walking hands up knees to rise with increased pain. Stood from [employer].    Ambulation/Gait Ambulation/Gait assistance: Modified independent (Device/Increase time) Gait Distance (Feet): 300 Feet Assistive device: None Gait Pattern/deviations: Step-through pattern, Decreased stride length, Wide base of support Gait velocity: good speed Gait velocity interpretation: 1.31 - 2.62 ft/sec, indicative of limited community ambulator   General Gait Details: Slow, steady gait with wide BoS, no evidence of imbalance.  Stairs            Wheelchair Mobility    Modified Rankin (Stroke Patients Only)       Balance Overall balance assessment: Needs assistance Sitting-balance support: Feet supported, No upper extremity supported Sitting balance-Leahy Scale: Fair Sitting balance - Comments: needs assist to donn shoes as pt with difficulty lifting LEs into hip flexion due to pain.   Standing balance support: During functional activity Standing balance-Leahy Scale: Fair                               Pertinent Vitals/Pain Pain Assessment Pain Assessment: 0-10 Pain Score: 6  Pain Location: back Pain Descriptors / Indicators: Sore, Operative site guarding Pain Intervention(s): Monitored during session, Repositioned    Home Living Family/patient  expects to be discharged to:: Private residence Living Arrangements: Spouse/significant other Available Help at Discharge: Family;Available  PRN/intermittently Type of Home: House Home Access: Stairs to enter   Entrance Stairs-Number of Steps: 1   Home Layout: One level Home Equipment: Conservation officer, nature (2 wheels);Tub bench;Wheelchair - manual      Prior Function Prior Level of Function : Independent/Modified Independent             Mobility Comments: Independent, works as a Building control surveyor, does all IADLs as wife in w/c, drives ADLs Comments: independent, fell last year in June resulting in back injury     Hand Dominance        Extremity/Trunk Assessment   Upper Extremity Assessment Upper Extremity Assessment: Defer to OT evaluation    Lower Extremity Assessment Lower Extremity Assessment: Overall WFL for tasks assessed    Cervical / Trunk Assessment Cervical / Trunk Assessment: Back Surgery  Communication   Communication: No difficulties  Cognition Arousal/Alertness: Awake/alert Behavior During Therapy: WFL for tasks assessed/performed Overall Cognitive Status: Within Functional Limits for tasks assessed                                          General Comments      Exercises     Assessment/Plan    PT Assessment Patient does not need any further PT services  PT Problem List         PT Treatment Interventions      PT Goals (Current goals can be found in the Care Plan section)  Acute Rehab PT Goals 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, get back to welding PT Goal Formulation: All assessment and education complete, DC therapy    Frequency       Co-evaluation               AM-PAC PT "6 Clicks" Mobility  Outcome Measure Help needed turning from your back to your side while in a flat bed without using bedrails?: A Little Help needed moving from lying on your back to sitting on the side of a flat bed without using bedrails?: A Little Help needed moving to and from a bed to a chair (including a wheelchair)?: A Little Help needed standing up from a chair using your arms (e.g.,  wheelchair or bedside chair)?: A Little Help needed to walk in hospital room?: None Help needed climbing 3-5 steps with a railing? : A Little 6 Click Score: 19    End of Session   Activity Tolerance: Patient tolerated treatment well;Patient limited by pain Patient left: in bed;with call bell/phone within reach (sitting EOB) Nurse Communication: Mobility status PT Visit Diagnosis: Pain;Difficulty in walking, not elsewhere classified (R26.2) Pain - part of body:  (back)    Time: [id number] PT Time Calculation (min) (ACUTE ONLY): 15 min   Charges:   PT Evaluation $PT Eval Low Complexity: 1 Low          Marisa Severin, PT, DPT Acute Rehabilitation Services Secure chat preferred Office [place] 03/16/2022, 8:42 AM

## 2022-03-16 NOTE — Progress Notes (Signed)
Neurosurgery Service Progress Note  Subjective: No acute events overnight. Reports resolution of pre-op radiculopathy. Back pain as expected. Some difficulty with urinating. Overall feeling very well.    Objective: Vitals:   03/15/22 2100 03/15/22 2320 03/16/22 0440 03/16/22 0723  BP: (!) 145/70 135/77 106/64 111/67  Pulse: 79 82 77 91  Resp: '20 20 20 16  '$ Temp: 98.5 F (36.9 C) 99 F (37.2 C) 98.7 F (37.1 C) 98 F (36.7 C)  TempSrc: Oral Oral Oral Oral  SpO2: 96% 95% 97% 93%  Weight:      Height:        Physical Exam: Strength 5/5 x4 and SILTx4, incision c/d/I   Assessment & Plan: 72 y.o. male s/p open L4-5 laminectomy and TLIF / PSF, recovering well.  -PT/OT -discharge home today, will send 2 week supply of flomax   Norm Parcel, PA-C 03/16/22 10:39 AM

## 2022-03-16 NOTE — Progress Notes (Signed)
Patient alert and oriented, mae's well, voiding adequate amount of urine, swallowing without difficulty, no c/o pain at time of discharge. Patient discharged home with family. Script and discharged instructions given to patient. Patient and family stated understanding of instructions given. Patient has an appointment with Dr. Ostergard   

## 2022-03-16 NOTE — Discharge Summary (Signed)
Discharge Summary  Date of Admission: 03/15/2022  Date of Discharge: 03/16/22  Attending Physician: Emelda Brothers, MD  Hospital Course: Patient was admitted following an uncomplicated open [id number] laminectomy and TLIF / PSF. They were recovered in PACU and transferred to [facility]. Their preop symptoms were improved, their hospital course was uncomplicated and the patient was discharged home today. They will follow up in clinic with me in clinic in 2 weeks.  Neurologic exam at discharge:  Strength 5/5 x4 and SILTx4, incision c/d/I   Discharge diagnosis: lumbar radiculopathy   Collene Schlichter, PA-C 03/16/22 10:41 AM

## 2022-03-16 NOTE — Discharge Instructions (Signed)
  Call Your Doctor If Any of These Occur Redness, drainage, or swelling at the wound.  Temperature greater than 101 degrees. Severe pain not relieved by pain medication. Incision starts to come apart.   

## 2022-05-16 DIAGNOSIS — D235 Other benign neoplasm of skin of trunk: Secondary | ICD-10-CM | POA: Diagnosis not present

## 2022-05-16 DIAGNOSIS — L2089 Other atopic dermatitis: Secondary | ICD-10-CM | POA: Diagnosis not present

## 2022-05-16 DIAGNOSIS — L821 Other seborrheic keratosis: Secondary | ICD-10-CM | POA: Diagnosis not present

## 2022-06-06 DIAGNOSIS — M4326 Fusion of spine, lumbar region: Secondary | ICD-10-CM | POA: Diagnosis not present

## 2022-06-08 ENCOUNTER — Other Ambulatory Visit (HOSPITAL_COMMUNITY): Payer: Self-pay | Admitting: Physician Assistant

## 2022-06-08 DIAGNOSIS — M4726 Other spondylosis with radiculopathy, lumbar region: Secondary | ICD-10-CM

## 2022-06-09 ENCOUNTER — Ambulatory Visit
Admission: RE | Admit: 2022-06-09 | Discharge: 2022-06-09 | Disposition: A | Discharge status: Home / Self Care | Payer: PPO | Source: Ambulatory Visit | Attending: Physician Assistant | Admitting: Physician Assistant

## 2022-06-09 DIAGNOSIS — M4726 Other spondylosis with radiculopathy, lumbar region: Secondary | ICD-10-CM | POA: Diagnosis not present

## 2022-06-09 DIAGNOSIS — R200 Anesthesia of skin: Secondary | ICD-10-CM | POA: Diagnosis not present

## 2022-07-18 DIAGNOSIS — M48062 Spinal stenosis, lumbar region with neurogenic claudication: Secondary | ICD-10-CM | POA: Diagnosis not present

## 2022-07-18 DIAGNOSIS — Z6836 Body mass index (BMI) 36.0-36.9, adult: Secondary | ICD-10-CM | POA: Diagnosis not present

## 2022-08-10 ENCOUNTER — Other Ambulatory Visit: Payer: Self-pay | Admitting: Neurological Surgery

## 2022-08-14 ENCOUNTER — Encounter (HOSPITAL_COMMUNITY): Payer: Self-pay

## 2022-08-14 NOTE — Progress Notes (Signed)
PCP - Dr Blair Heys Cardiologist - n/a  Chest x-ray - n/a EKG - 03/12/22 Stress Test - n/a ECHO - n/a Cardiac Cath - n/a  ICD Pacemaker/Loop - n/a  Sleep Study -  n/a CPAP - none  Diabetes -n/a   NPO  Anesthesia review: Yes  STOP now taking any Aspirin (unless otherwise instructed by your surgeon), Aleve, Naproxen, Ibuprofen, Motrin, Advil, Goody's, BC's, all herbal medications, fish oil, and all vitamins.   Coronavirus Screening Do you have any of the following symptoms:  Cough yes/no: No Fever (>100.54F)  yes/no: No Runny nose yes/no: No Sore throat yes/no: No Difficulty breathing/shortness of breath  yes/no: No  Have you traveled in the last 14 days and where? yes/no: No  Patient verbalized understanding of instructions that were given to them at the PAT appointment. Patient was also instructed that they will need to review over the PAT instructions again at home before surgery.

## 2022-08-14 NOTE — Pre-Procedure Instructions (Signed)
Surgical Instructions   Your procedure is scheduled on Tuesday, August 13th. Report to [facility] Main Entrance "A" at 07:50 A.M., then check in with the Admitting office. Any questions or running late day of surgery: call [phone]  Questions prior to your surgery date: call [phone], Monday-Friday, 8am-4pm. If you experience any cold or flu symptoms such as cough, fever, chills, shortness of breath, etc. between now and your scheduled surgery, please notify us at the above number.     Remember:  Do not eat or drink after midnight the night before your surgery     Take these medicines the morning of surgery with A SIP OF WATER  atenolol (TENORMIN)  pregabalin (LYRICA)   May take these medicines IF NEEDED: acetaminophen (TYLENOL)     One week prior to surgery, STOP taking any Aspirin (unless otherwise instructed by your surgeon) Aleve, Naproxen, Ibuprofen, Motrin, Advil, Goody's, BC's, all herbal medications, fish oil, and non-prescription vitamins.                     Do NOT Smoke (Tobacco/Vaping) for 24 hours prior to your procedure.  If you use a CPAP at night, you may bring your mask/headgear for your overnight stay.   You will be asked to remove any contacts, glasses, piercing's, hearing aid's, dentures/partials prior to surgery. Please bring cases for these items if needed.    Patients discharged the day of surgery will not be allowed to drive home, and someone needs to stay with them for 24 hours.  SURGICAL WAITING ROOM VISITATION Patients may have no more than 2 support people in the waiting area - these visitors may rotate.   Pre-op nurse will coordinate an appropriate time for 1 ADULT support person, who may not rotate, to accompany patient in pre-op.  Children under the age of 31 must have an adult with them who is not the patient and must remain in the main waiting area with an adult.  If the patient needs to stay at the hospital during part of their  recovery, the visitor guidelines for inpatient rooms apply.  Please refer to the [facility] website for the visitor guidelines for any additional information.   If you received a COVID test during your pre-op visit  it is requested that you wear a mask when out in public, stay away from anyone that may not be feeling well and notify your surgeon if you develop symptoms. If you have been in contact with anyone that has tested positive in the last 10 days please notify you surgeon.      Pre-operative 5 CHG Bathing Instructions   You can play a key role in reducing the risk of infection after surgery. Your skin needs to be as free of germs as possible. You can reduce the number of germs on your skin by washing with CHG (chlorhexidine gluconate) soap before surgery. CHG is an antiseptic soap that kills germs and continues to kill germs even after washing.   DO NOT use if you have an allergy to chlorhexidine/CHG or antibacterial soaps. If your skin becomes reddened or irritated, stop using the CHG and notify one of our RNs at [phone].   Please shower with the CHG soap starting 4 days before surgery using the following schedule:     Please keep in mind the following:  DO NOT shave, including legs and underarms, starting the day of your first shower.   You may shave your face at any point  before/day of surgery.  Place clean sheets on your bed the day you start using CHG soap. Use a clean washcloth (not used since being washed) for each shower. DO NOT sleep with pets once you start using the CHG.   CHG Shower Instructions:  If you choose to wash your hair and private area, wash first with your normal shampoo/soap.  After you use shampoo/soap, rinse your hair and body thoroughly to remove shampoo/soap residue.  Turn the water OFF and apply about 3 tablespoons (45 ml) of CHG soap to a CLEAN washcloth.  Apply CHG soap ONLY FROM YOUR NECK DOWN TO YOUR TOES (washing for 3-5 minutes)  DO NOT  use CHG soap on face, private areas, open wounds, or sores.  Pay special attention to the area where your surgery is being performed.  If you are having back surgery, having someone wash your back for you may be helpful. Wait 2 minutes after CHG soap is applied, then you may rinse off the CHG soap.  Pat dry with a clean towel  Put on clean clothes/pajamas   If you choose to wear lotion, please use ONLY the CHG-compatible lotions on the back of this paper.   Additional instructions for the day of surgery: DO NOT APPLY any lotions, deodorants, cologne, or perfumes.   Do not bring valuables to the hospital. [facility] is not responsible for any belongings/valuables. Do not wear nail polish, gel polish, artificial nails, or any other type of covering on natural nails (fingers and toes) Do not wear jewelry or makeup Put on clean/comfortable clothes.  Please brush your teeth.  Ask your nurse before applying any prescription medications to the skin.     CHG Compatible Lotions   Aveeno Moisturizing lotion  Cetaphil Moisturizing Cream  Cetaphil Moisturizing Lotion  Clairol Herbal Essence Moisturizing Lotion, Dry Skin  Clairol Herbal Essence Moisturizing Lotion, Extra Dry Skin  Clairol Herbal Essence Moisturizing Lotion, Normal Skin  Curel Age Defying Therapeutic Moisturizing Lotion with Alpha Hydroxy  Curel Extreme Care Body Lotion  Curel Soothing Hands Moisturizing Hand Lotion  Curel Therapeutic Moisturizing Cream, Fragrance-Free  Curel Therapeutic Moisturizing Lotion, Fragrance-Free  Curel Therapeutic Moisturizing Lotion, Original Formula  Eucerin Daily Replenishing Lotion  Eucerin Dry Skin Therapy Plus Alpha Hydroxy Crme  Eucerin Dry Skin Therapy Plus Alpha Hydroxy Lotion  Eucerin Original Crme  Eucerin Original Lotion  Eucerin Plus Crme Eucerin Plus Lotion  Eucerin TriLipid Replenishing Lotion  Keri Anti-Bacterial Hand Lotion  Keri Deep Conditioning Original Lotion Dry Skin  Formula Softly Scented  Keri Deep Conditioning Original Lotion, Fragrance Free Sensitive Skin Formula  Keri Lotion Fast Absorbing Fragrance Free Sensitive Skin Formula  Keri Lotion Fast Absorbing Softly Scented Dry Skin Formula  Keri Original Lotion  Keri Skin Renewal Lotion Keri Silky Smooth Lotion  Keri Silky Smooth Sensitive Skin Lotion  Nivea Body Creamy Conditioning Oil  Nivea Body Extra Enriched Lotion  Nivea Body Original Lotion  Nivea Body Sheer Moisturizing Lotion Nivea Crme  Nivea Skin Firming Lotion  NutraDerm 30 Skin Lotion  NutraDerm Skin Lotion  NutraDerm Therapeutic Skin Cream  NutraDerm Therapeutic Skin Lotion  ProShield Protective Hand Cream  Provon moisturizing lotion  Please read over the following fact sheets that you were given.

## 2022-08-15 ENCOUNTER — Other Ambulatory Visit: Payer: Self-pay

## 2022-08-15 ENCOUNTER — Encounter (HOSPITAL_COMMUNITY)
Admission: RE | Admit: 2022-08-15 | Discharge: 2022-08-15 | Disposition: A | Discharge status: Home / Self Care | Payer: PPO | Source: Ambulatory Visit | Attending: Neurological Surgery | Admitting: Neurological Surgery

## 2022-08-15 ENCOUNTER — Encounter (HOSPITAL_COMMUNITY): Payer: Self-pay

## 2022-08-15 VITALS — BP 140/70 | HR 58 | Temp 98.2°F | Resp 18 | Ht 70.0 in | Wt 256.8 lb

## 2022-08-15 DIAGNOSIS — Z01818 Encounter for other preprocedural examination: Secondary | ICD-10-CM

## 2022-08-15 DIAGNOSIS — Z01812 Encounter for preprocedural laboratory examination: Secondary | ICD-10-CM | POA: Insufficient documentation

## 2022-08-15 LAB — CBC
HCT: 41.3 % (ref 39.0–52.0)
Hemoglobin: 13.8 g/dL (ref 13.0–17.0)
MCH: 30.2 pg (ref 26.0–34.0)
MCHC: 33.4 g/dL (ref 30.0–36.0)
MCV: 90.4 fL (ref 80.0–100.0)
Platelets: 184 10*3/uL (ref 150–400)
RBC: 4.57 MIL/uL (ref 4.22–5.81)
RDW: 12.5 % (ref 11.5–15.5)
WBC: 5.3 10*3/uL (ref 4.0–10.5)
nRBC: 0 % (ref 0.0–0.2)

## 2022-08-15 LAB — SURGICAL PCR SCREEN
MRSA, PCR: NEGATIVE
Staphylococcus aureus: NEGATIVE

## 2022-08-15 LAB — BASIC METABOLIC PANEL
Anion gap: 10 (ref 5–15)
BUN: 20 mg/dL (ref 8–23)
CO2: 23 mmol/L (ref 22–32)
Calcium: 8.8 mg/dL — ABNORMAL LOW (ref 8.9–10.3)
Chloride: 103 mmol/L (ref 98–111)
Creatinine, Ser: 1.01 mg/dL (ref 0.61–1.24)
GFR, Estimated: >60 mL/min (ref >60)
Glucose, Bld: 169 mg/dL — ABNORMAL HIGH (ref 70–99)
Potassium: 3.3 mmol/L — ABNORMAL LOW (ref 3.5–5.1)
Sodium: 136 mmol/L (ref 135–145)

## 2022-08-15 LAB — TYPE AND SCREEN
ABO/RH(D): A POS
Antibody Screen: NEGATIVE

## 2022-08-16 ENCOUNTER — Encounter (HOSPITAL_COMMUNITY): Payer: Self-pay

## 2022-08-21 ENCOUNTER — Encounter (HOSPITAL_COMMUNITY)
Admission: RE | Disposition: A | Discharge status: Home / Self Care | Payer: Self-pay | Source: Home / Self Care | Attending: Neurological Surgery

## 2022-08-21 ENCOUNTER — Other Ambulatory Visit: Payer: Self-pay

## 2022-08-21 ENCOUNTER — Inpatient Hospital Stay (HOSPITAL_COMMUNITY)
Admission: RE | Admit: 2022-08-21 | Discharge: 2022-08-22 | DRG: 455 | Disposition: A | Discharge status: Home / Self Care | Payer: PPO | Attending: Neurological Surgery | Admitting: Neurological Surgery

## 2022-08-21 ENCOUNTER — Inpatient Hospital Stay (HOSPITAL_COMMUNITY): Payer: PPO | Admitting: Physician Assistant

## 2022-08-21 ENCOUNTER — Encounter (HOSPITAL_COMMUNITY): Payer: Self-pay | Admitting: Neurological Surgery

## 2022-08-21 ENCOUNTER — Inpatient Hospital Stay (HOSPITAL_COMMUNITY): Payer: PPO

## 2022-08-21 DIAGNOSIS — M5416 Radiculopathy, lumbar region: Secondary | ICD-10-CM

## 2022-08-21 DIAGNOSIS — M1990 Unspecified osteoarthritis, unspecified site: Secondary | ICD-10-CM | POA: Diagnosis not present

## 2022-08-21 DIAGNOSIS — M47816 Spondylosis without myelopathy or radiculopathy, lumbar region: Secondary | ICD-10-CM | POA: Diagnosis present

## 2022-08-21 DIAGNOSIS — M4856XA Collapsed vertebra, not elsewhere classified, lumbar region, initial encounter for fracture: Secondary | ICD-10-CM | POA: Diagnosis not present

## 2022-08-21 DIAGNOSIS — I10 Essential (primary) hypertension: Secondary | ICD-10-CM | POA: Diagnosis not present

## 2022-08-21 DIAGNOSIS — M48062 Spinal stenosis, lumbar region with neurogenic claudication: Secondary | ICD-10-CM | POA: Diagnosis not present

## 2022-08-21 DIAGNOSIS — M532X6 Spinal instabilities, lumbar region: Secondary | ICD-10-CM | POA: Diagnosis not present

## 2022-08-21 DIAGNOSIS — Z981 Arthrodesis status: Secondary | ICD-10-CM

## 2022-08-21 HISTORY — PX: TRANSFORAMINAL LUMBAR INTERBODY FUSION (TLIF) WITH PEDICLE SCREW FIXATION 1 LEVEL: SHX6141

## 2022-08-21 SURGERY — TRANSFORAMINAL LUMBAR INTERBODY FUSION (TLIF) WITH PEDICLE SCREW FIXATION 1 LEVEL
Anesthesia: General | Site: Spine Lumbar

## 2022-08-21 MED ORDER — OXYCODONE HCL 5 MG/5ML PO SOLN: 5.0000 mg | Freq: Once | ORAL | Status: DC | PRN

## 2022-08-21 MED ORDER — FENTANYL CITRATE (PF) 250 MCG/5ML IJ SOLN
INTRAMUSCULAR | Status: AC
  Filled 2022-08-21: qty 5

## 2022-08-21 MED ORDER — SODIUM CHLORIDE 0.9% FLUSH: 3.0000 mL | INTRAVENOUS | Status: DC | PRN

## 2022-08-21 MED ORDER — HYDROCHLOROTHIAZIDE 25 MG PO TABS
25.0000 mg | ORAL_TABLET | Freq: Every day | ORAL | Status: DC
  Administered 2022-08-21 – 2022-08-22 (×2): 25 mg via ORAL
  Filled 2022-08-21 (×2): qty 1

## 2022-08-21 MED ORDER — LIDOCAINE 2% (20 MG/ML) 5 ML SYRINGE
INTRAMUSCULAR | Status: DC | PRN
  Administered 2022-08-21: 60 mg via INTRAVENOUS
  Administered 2022-08-21: 40 mg via INTRAVENOUS

## 2022-08-21 MED ORDER — PHENYLEPHRINE HCL-NACL 20-0.9 MG/250ML-% IV SOLN
INTRAVENOUS | Status: DC | PRN
  Administered 2022-08-21: 20 ug/min via INTRAVENOUS

## 2022-08-21 MED ORDER — CYCLOBENZAPRINE HCL 10 MG PO TABS
10.0000 mg | ORAL_TABLET | Freq: Three times a day (TID) | ORAL | Status: DC | PRN
  Administered 2022-08-21: 10 mg via ORAL
  Filled 2022-08-21: qty 1

## 2022-08-21 MED ORDER — TRAMADOL HCL 50 MG PO TABS
50.0000 mg | ORAL_TABLET | Freq: Four times a day (QID) | ORAL | Status: DC | PRN
  Administered 2022-08-21: 50 mg via ORAL
  Filled 2022-08-21: qty 1

## 2022-08-21 MED ORDER — PREGABALIN 75 MG PO CAPS
75.0000 mg | ORAL_CAPSULE | Freq: Two times a day (BID) | ORAL | Status: DC
  Administered 2022-08-21 – 2022-08-22 (×3): 75 mg via ORAL
  Filled 2022-08-21 (×3): qty 1

## 2022-08-21 MED ORDER — LIDOCAINE 2% (20 MG/ML) 5 ML SYRINGE
INTRAMUSCULAR | Status: AC
  Filled 2022-08-21: qty 5

## 2022-08-21 MED ORDER — FENTANYL CITRATE (PF) 100 MCG/2ML IJ SOLN
25.0000 ug | INTRAMUSCULAR | Status: DC | PRN
  Administered 2022-08-21: 50 ug via INTRAVENOUS

## 2022-08-21 MED ORDER — LIDOCAINE-EPINEPHRINE 1 %-1:100000 IJ SOLN
INTRAMUSCULAR | Status: DC | PRN
  Administered 2022-08-21: 10 mL

## 2022-08-21 MED ORDER — ORAL CARE MOUTH RINSE: 15.0000 mL | Freq: Once | OROMUCOSAL | Status: AC

## 2022-08-21 MED ORDER — ONDANSETRON HCL 4 MG/2ML IJ SOLN
INTRAMUSCULAR | Status: DC | PRN
  Administered 2022-08-21: 4 mg via INTRAVENOUS

## 2022-08-21 MED ORDER — ACETAMINOPHEN 325 MG PO TABS: 650.0000 mg | ORAL_TABLET | ORAL | Status: DC | PRN

## 2022-08-21 MED ORDER — CHLORHEXIDINE GLUCONATE CLOTH 2 % EX PADS: 6.0000 | MEDICATED_PAD | Freq: Once | CUTANEOUS | Status: DC

## 2022-08-21 MED ORDER — ONDANSETRON HCL 4 MG/2ML IJ SOLN
INTRAMUSCULAR | Status: AC
  Filled 2022-08-21: qty 2

## 2022-08-21 MED ORDER — FENTANYL CITRATE (PF) 250 MCG/5ML IJ SOLN
INTRAMUSCULAR | Status: DC | PRN
  Administered 2022-08-21 (×2): 50 ug via INTRAVENOUS
  Administered 2022-08-21: 100 ug via INTRAVENOUS
  Administered 2022-08-21: 50 ug via INTRAVENOUS

## 2022-08-21 MED ORDER — ONDANSETRON HCL 4 MG/2ML IJ SOLN: 4.0000 mg | Freq: Four times a day (QID) | INTRAMUSCULAR | Status: DC | PRN

## 2022-08-21 MED ORDER — SUGAMMADEX SODIUM 200 MG/2ML IV SOLN
INTRAVENOUS | Status: DC | PRN
  Administered 2022-08-21 (×2): 200 mg via INTRAVENOUS

## 2022-08-21 MED ORDER — ROCURONIUM BROMIDE 10 MG/ML (PF) SYRINGE
PREFILLED_SYRINGE | INTRAVENOUS | Status: DC | PRN
  Administered 2022-08-21: 10 mg via INTRAVENOUS
  Administered 2022-08-21: 50 mg via INTRAVENOUS
  Administered 2022-08-21: 20 mg via INTRAVENOUS
  Administered 2022-08-21: 30 mg via INTRAVENOUS

## 2022-08-21 MED ORDER — EPHEDRINE SULFATE-NACL 50-0.9 MG/10ML-% IV SOSY
PREFILLED_SYRINGE | INTRAVENOUS | Status: DC | PRN
  Administered 2022-08-21 (×3): 5 mg via INTRAVENOUS

## 2022-08-21 MED ORDER — HYDROMORPHONE HCL 1 MG/ML IJ SOLN
INTRAMUSCULAR | Status: AC
  Filled 2022-08-21: qty 0.5

## 2022-08-21 MED ORDER — 0.9 % SODIUM CHLORIDE (POUR BTL) OPTIME
TOPICAL | Status: DC | PRN
  Administered 2022-08-21: 1000 mL

## 2022-08-21 MED ORDER — PHENOL 1.4 % MT LIQD: 1.0000 | OROMUCOSAL | Status: DC | PRN

## 2022-08-21 MED ORDER — DEXAMETHASONE SODIUM PHOSPHATE 10 MG/ML IJ SOLN
INTRAMUSCULAR | Status: AC
  Filled 2022-08-21: qty 1

## 2022-08-21 MED ORDER — LISINOPRIL 10 MG PO TABS
10.0000 mg | ORAL_TABLET | Freq: Every day | ORAL | Status: DC
  Administered 2022-08-21 – 2022-08-22 (×2): 10 mg via ORAL
  Filled 2022-08-21 (×2): qty 1

## 2022-08-21 MED ORDER — CHLORHEXIDINE GLUCONATE 0.12 % MT SOLN
OROMUCOSAL | Status: AC
  Administered 2022-08-21: 15 mL via OROMUCOSAL
  Filled 2022-08-21: qty 15

## 2022-08-21 MED ORDER — THROMBIN 5000 UNITS EX SOLR
CUTANEOUS | Status: AC
  Filled 2022-08-21: qty 5000

## 2022-08-21 MED ORDER — THROMBIN 5000 UNITS EX SOLR
OROMUCOSAL | Status: DC | PRN
  Administered 2022-08-21: 5 mL via TOPICAL

## 2022-08-21 MED ORDER — PROPOFOL 10 MG/ML IV BOLUS
INTRAVENOUS | Status: DC | PRN
Start: 2022-08-21 — End: 2022-08-21
  Administered 2022-08-21: 130 mg via INTRAVENOUS
  Administered 2022-08-21: 30 mg via INTRAVENOUS

## 2022-08-21 MED ORDER — LIDOCAINE-EPINEPHRINE 1 %-1:100000 IJ SOLN
INTRAMUSCULAR | Status: AC
  Filled 2022-08-21: qty 1

## 2022-08-21 MED ORDER — SODIUM CHLORIDE 0.9 % IV SOLN
250.0000 mL | INTRAVENOUS | Status: DC
  Administered 2022-08-21: 250 mL via INTRAVENOUS

## 2022-08-21 MED ORDER — PROPOFOL 10 MG/ML IV BOLUS
INTRAVENOUS | Status: AC
  Filled 2022-08-21: qty 20

## 2022-08-21 MED ORDER — HYDROMORPHONE HCL 1 MG/ML IJ SOLN: 1.0000 mg | INTRAMUSCULAR | Status: DC | PRN

## 2022-08-21 MED ORDER — ACETAMINOPHEN 650 MG RE SUPP: 650.0000 mg | RECTAL | Status: DC | PRN

## 2022-08-21 MED ORDER — DOCUSATE SODIUM 100 MG PO CAPS
100.0000 mg | ORAL_CAPSULE | Freq: Two times a day (BID) | ORAL | Status: DC
  Administered 2022-08-21 – 2022-08-22 (×3): 100 mg via ORAL
  Filled 2022-08-21 (×3): qty 1

## 2022-08-21 MED ORDER — POLYETHYLENE GLYCOL 3350 17 G PO PACK: 17.0000 g | PACK | Freq: Every day | ORAL | Status: DC | PRN

## 2022-08-21 MED ORDER — HYDROMORPHONE HCL 1 MG/ML IJ SOLN
INTRAMUSCULAR | Status: DC | PRN
  Administered 2022-08-21: .5 mg via INTRAVENOUS

## 2022-08-21 MED ORDER — ATENOLOL 50 MG PO TABS
50.0000 mg | ORAL_TABLET | Freq: Every day | ORAL | Status: DC
  Administered 2022-08-22: 50 mg via ORAL
  Filled 2022-08-21: qty 1

## 2022-08-21 MED ORDER — DEXAMETHASONE SODIUM PHOSPHATE 10 MG/ML IJ SOLN
INTRAMUSCULAR | Status: DC | PRN
  Administered 2022-08-21: 5 mg via INTRAVENOUS

## 2022-08-21 MED ORDER — ONDANSETRON HCL 4 MG PO TABS: 4.0000 mg | ORAL_TABLET | Freq: Four times a day (QID) | ORAL | Status: DC | PRN

## 2022-08-21 MED ORDER — CEFAZOLIN SODIUM-DEXTROSE 2-4 GM/100ML-% IV SOLN
2.0000 g | Freq: Three times a day (TID) | INTRAVENOUS | Status: AC
  Administered 2022-08-21 – 2022-08-22 (×2): 2 g via INTRAVENOUS
  Filled 2022-08-21 (×2): qty 100

## 2022-08-21 MED ORDER — LACTATED RINGERS IV SOLN: INTRAVENOUS | Status: DC

## 2022-08-21 MED ORDER — SODIUM CHLORIDE 0.9% FLUSH
3.0000 mL | Freq: Two times a day (BID) | INTRAVENOUS | Status: DC
  Administered 2022-08-21 (×2): 3 mL via INTRAVENOUS

## 2022-08-21 MED ORDER — OXYCODONE HCL 5 MG PO TABS: 5.0000 mg | ORAL_TABLET | Freq: Once | ORAL | Status: DC | PRN

## 2022-08-21 MED ORDER — LACTATED RINGERS IV SOLN: INTRAVENOUS | Status: DC | PRN

## 2022-08-21 MED ORDER — TRAMADOL HCL 50 MG PO TABS: 100.0000 mg | ORAL_TABLET | Freq: Four times a day (QID) | ORAL | Status: DC | PRN

## 2022-08-21 MED ORDER — ROCURONIUM BROMIDE 10 MG/ML (PF) SYRINGE
PREFILLED_SYRINGE | INTRAVENOUS | Status: AC
  Filled 2022-08-21: qty 10

## 2022-08-21 MED ORDER — FENTANYL CITRATE (PF) 100 MCG/2ML IJ SOLN
INTRAMUSCULAR | Status: AC
  Filled 2022-08-21: qty 2

## 2022-08-21 MED ORDER — CHLORHEXIDINE GLUCONATE 0.12 % MT SOLN: 15.0000 mL | Freq: Once | OROMUCOSAL | Status: AC

## 2022-08-21 MED ORDER — KETOCONAZOLE 2 % EX CREA
1.0000 | TOPICAL_CREAM | Freq: Every day | CUTANEOUS | Status: DC
  Filled 2022-08-21: qty 15

## 2022-08-21 MED ORDER — CEFAZOLIN SODIUM-DEXTROSE 2-4 GM/100ML-% IV SOLN
2.0000 g | INTRAVENOUS | Status: AC
  Administered 2022-08-21: 2 g via INTRAVENOUS
  Filled 2022-08-21: qty 100

## 2022-08-21 MED ORDER — MENTHOL 3 MG MT LOZG: 1.0000 | LOZENGE | OROMUCOSAL | Status: DC | PRN

## 2022-08-21 SURGICAL SUPPLY — 81 items
ADH SKN CLS APL DERMABOND .7 (GAUZE/BANDAGES/DRESSINGS) ×2
APL SKNCLS STERI-STRIP NONHPOA (GAUZE/BANDAGES/DRESSINGS)
BAG COUNTER SPONGE SURGICOUNT (BAG) ×3 IMPLANT
BAG SPNG CNTER NS LX DISP (BAG) ×2
BASKET BONE COLLECTION (BASKET) ×3 IMPLANT
BENZOIN TINCTURE PRP APPL 2/3 (GAUZE/BANDAGES/DRESSINGS) IMPLANT
BLADE CLIPPER SURG (BLADE) IMPLANT
BLADE SURG 11 STRL SS (BLADE) ×3 IMPLANT
BUR 14 MATCH 3 (BUR) IMPLANT
BUR MATCHSTICK NEURO 3.0 LAGG (BURR) ×3 IMPLANT
BUR MR8 14CM BALL SYMTRI 5 (BUR) IMPLANT
BUR PRECISION FLUTE 5.0 (BURR) ×3 IMPLANT
BURR 14 MATCH 3 (BUR)
BURR MR8 14CM BALL SYMTRI 5 (BUR) ×2
CANISTER SUCT 3000ML PPV (MISCELLANEOUS) ×3 IMPLANT
CNTNR URN SCR LID CUP LEK RST (MISCELLANEOUS) ×3 IMPLANT
CONT SPEC 4OZ STRL OR WHT (MISCELLANEOUS) ×2
COVER BACK TABLE 60X90IN (DRAPES) ×3 IMPLANT
COVERAGE SUPPORT O-ARM STEALTH (MISCELLANEOUS) ×2 IMPLANT
DERMABOND ADVANCED .7 DNX12 (GAUZE/BANDAGES/DRESSINGS) ×3 IMPLANT
DRAPE C-ARM 42X72 X-RAY (DRAPES) IMPLANT
DRAPE C-ARMOR (DRAPES) IMPLANT
DRAPE LAPAROTOMY 100X72X124 (DRAPES) ×3 IMPLANT
DRAPE MICROSCOPE SLANT 54X150 (MISCELLANEOUS) IMPLANT
DRAPE SHEET LG 3/4 BI-LAMINATE (DRAPES) ×12 IMPLANT
DRAPE SURG 17X23 STRL (DRAPES) ×3 IMPLANT
DURAPREP 26ML APPLICATOR (WOUND CARE) ×3 IMPLANT
ELECT REM PT RETURN 9FT ADLT (ELECTROSURGICAL) ×2
ELECTRODE REM PT RTRN 9FT ADLT (ELECTROSURGICAL) ×3 IMPLANT
FEE COVERAGE SUPPORT O-ARM (MISCELLANEOUS) ×3 IMPLANT
GAUZE 4X4 16PLY ~~LOC~~+RFID DBL (SPONGE) IMPLANT
GAUZE SPONGE 4X4 12PLY STRL (GAUZE/BANDAGES/DRESSINGS) IMPLANT
GLOVE BIO SURGEON STRL SZ 6.5 (GLOVE) IMPLANT
GLOVE BIO SURGEON STRL SZ7.5 (GLOVE) ×6 IMPLANT
GLOVE BIOGEL PI IND STRL 7.0 (GLOVE) ×6 IMPLANT
GLOVE BIOGEL PI IND STRL 7.5 (GLOVE) ×6 IMPLANT
GLOVE ECLIPSE 7.5 STRL STRAW (GLOVE) ×6 IMPLANT
GLOVE EXAM NITRILE LRG STRL (GLOVE) IMPLANT
GLOVE EXAM NITRILE XL STR (GLOVE) IMPLANT
GLOVE EXAM NITRILE XS STR PU (GLOVE) IMPLANT
GOWN STRL REUS W/ TWL LRG LVL3 (GOWN DISPOSABLE) ×12 IMPLANT
GOWN STRL REUS W/ TWL XL LVL3 (GOWN DISPOSABLE) IMPLANT
GOWN STRL REUS W/TWL 2XL LVL3 (GOWN DISPOSABLE) IMPLANT
GOWN STRL REUS W/TWL LRG LVL3 (GOWN DISPOSABLE) ×8
GOWN STRL REUS W/TWL XL LVL3 (GOWN DISPOSABLE)
HEMOSTAT POWDER KIT SURGIFOAM (HEMOSTASIS) ×3 IMPLANT
KIT BASIN OR (CUSTOM PROCEDURE TRAY) ×3 IMPLANT
KIT POSITION SURG JACKSON T1 (MISCELLANEOUS) ×3 IMPLANT
KIT TURNOVER KIT B (KITS) ×3 IMPLANT
MARKER SPHERE PSV REFLC NDI (MISCELLANEOUS) ×15 IMPLANT
MILL BONE PREP (MISCELLANEOUS) IMPLANT
MILL MEDIUM DISP (BLADE) IMPLANT
NDL HYPO 18GX1.5 BLUNT FILL (NEEDLE) IMPLANT
NDL HYPO 22X1.5 SAFETY MO (MISCELLANEOUS) ×3 IMPLANT
NDL SPNL 18GX3.5 QUINCKE PK (NEEDLE) IMPLANT
NEEDLE HYPO 18GX1.5 BLUNT FILL (NEEDLE) IMPLANT
NEEDLE HYPO 22X1.5 SAFETY MO (MISCELLANEOUS) ×2 IMPLANT
NEEDLE SPNL 18GX3.5 QUINCKE PK (NEEDLE) IMPLANT
NS IRRIG 1000ML POUR BTL (IV SOLUTION) ×3 IMPLANT
PACK LAMINECTOMY NEURO (CUSTOM PROCEDURE TRAY) ×3 IMPLANT
PAD ARMBOARD 7.5X6 YLW CONV (MISCELLANEOUS) ×9 IMPLANT
ROD PED SOLERA 5.5X60 (Rod) IMPLANT
SCREW MAS/SET STERILE 4PK (Screw) IMPLANT
SCREW SET SOLERA (Screw) ×4 IMPLANT
SCREW SET SOLERA TI5.5 (Screw) IMPLANT
SCREW SOLERA 45X6.5XMA NS SPNE (Screw) IMPLANT
SCREW SOLERA 6.5X45MM (Screw) ×4 IMPLANT
SPACER PL CATALYFT LONG 11 (Spacer) IMPLANT
SPIKE FLUID TRANSFER (MISCELLANEOUS) ×3 IMPLANT
SPONGE SURGIFOAM ABS GEL 100 (HEMOSTASIS) IMPLANT
SPONGE T-LAP 4X18 ~~LOC~~+RFID (SPONGE) IMPLANT
STRIP CLOSURE SKIN 1/2X4 (GAUZE/BANDAGES/DRESSINGS) IMPLANT
SUT MNCRL AB 3-0 PS2 18 (SUTURE) ×3 IMPLANT
SUT VIC AB 0 CT1 18XCR BRD8 (SUTURE) ×3 IMPLANT
SUT VIC AB 0 CT1 8-18 (SUTURE) ×2
SUT VIC AB 2-0 CP2 18 (SUTURE) ×3 IMPLANT
SYR 3ML LL SCALE MARK (SYRINGE) IMPLANT
TOWEL GREEN STERILE (TOWEL DISPOSABLE) ×3 IMPLANT
TOWEL GREEN STERILE FF (TOWEL DISPOSABLE) ×3 IMPLANT
TRAY FOLEY MTR SLVR 16FR STAT (SET/KITS/TRAYS/PACK) ×3 IMPLANT
WATER STERILE IRR 1000ML POUR (IV SOLUTION) ×3 IMPLANT

## 2022-08-21 NOTE — Anesthesia Procedure Notes (Addendum)
Procedure Name: Intubation Date/Time: 08/21/2022 10:45 AM  Performed by: Owens Loffler, RNPre-anesthesia Checklist: Patient identified, Emergency Drugs available, Suction available, Patient being monitored and Timeout performed Patient Re-evaluated:Patient Re-evaluated prior to induction Oxygen Delivery Method: Circle system utilized Preoxygenation: Pre-oxygenation with 100% oxygen Induction Type: IV induction Ventilation: Oral airway inserted - appropriate to patient size Laryngoscope Size: Mac and 4 Grade View: Grade I Tube type: Oral Tube size: 7.5 mm Number of attempts: 1 Airway Equipment and Method: Stylet Placement Confirmation: ETT inserted through vocal cords under direct vision, positive ETCO2, CO2 detector and breath sounds checked- equal and bilateral Secured at: 22 cm Tube secured with: Tape Dental Injury: Teeth and Oropharynx as per pre-operative assessment

## 2022-08-21 NOTE — Anesthesia Postprocedure Evaluation (Signed)
Anesthesia Post Note  Patient: David Tyler  Procedure(s) Performed: Extension of fusion to Lumbar three-four with laminectomy and decompression, transforaminal lumbar interbody fusion and posterolateral instrumentation (Spine Lumbar) Application of O-Arm     Patient location during evaluation: PACU Anesthesia Type: General Level of consciousness: awake and alert Pain management: pain level controlled Vital Signs Assessment: post-procedure vital signs reviewed and stable Respiratory status: spontaneous breathing, nonlabored ventilation, respiratory function stable and patient connected to nasal cannula oxygen Cardiovascular status: blood pressure returned to baseline and stable Postop Assessment: no apparent nausea or vomiting Anesthetic complications: no   There were no known notable events for this encounter.  Last Vitals:  Vitals:   08/21/22 1430 08/21/22 1450  BP: (!) 142/84 (!) 145/71  Pulse: (!) 50 (!) 51  Resp: 16 18  Temp:  36.4 C  SpO2: 94% 96%    Last Pain:  Vitals:   08/21/22 1450  TempSrc: Oral  PainSc:                  Shamyra Farias S

## 2022-08-21 NOTE — Op Note (Signed)
PATIENT: David Tyler  DAY OF SURGERY: 08/21/22   PRE-OPERATIVE DIAGNOSIS:  Lumbar adjacent segment disease, lumbar stenosis with neurogenic claudication   POST-OPERATIVE DIAGNOSIS:  Same   PROCEDURE:  L3-4 open decompressive laminectomy, L3-4 transforaminal lumbar interbody fusion, extension of fusion to L3-4   SURGEON:  Surgeon(s) and Role:    Jadene Pierini, MD    Emilee Hero PA   ANESTHESIA: ETGA   BRIEF HISTORY: This is a 72 year old man in whom I previously performed a lumbar laminectomy and TLIF/PLF who presented with fairly rapidly recurrent symptoms a few months post-op. Repeat MRI showed good decompression at L4-5 but fairly rapid progression of his adjacent segment disease and stenosis at L3-4. I therefore recommended decompression of the adjacent segment with extension of his construct. This was discussed with the patient as well as risks, benefits, and alternatives and wished to proceed with surgery.   OPERATIVE DETAIL: The patient was taken to the operating room and anesthesia was induced by the anesthesia team. They were placed on the OR table in the prone position with padding of all pressure points. A formal time out was performed with two patient identifiers and confirmed the operative site. The operative site was marked, hair was clipped with surgical clippers, the area was then prepped and draped in a sterile fashion. The patient's prior midline incision was opened to expose the existing hardware at L4-5 and subperiosteal dissection was performed to expose L3-4. The L3-4 facet was impressively loose on the right greater than left with obvious motion at that segment while dissecting.   A spinous process clamp was applied and secured, followed by attachment of a reference array. The field was draped and the O-arm was brought into the field. An intra-op CT was performed, sent to the Stealth navigation station, registered to the patient's anatomy, and confirmed  with landmarks with acceptable fit. Stereotactic spinal navigation was utilized throughout the procedure for planning and placement of pedicle screw trajectories.  Instrumentation was then performed. Frameless stereotaxy was used to guide placement of bilateral pedicle screws ([employer]) at L3. These were placed with navigated instruments by localizing the pedicle, drilling a pilot hole, cannulating the pedicle with an awl-tap, palpating for pedicle wall breaches, and then placing the screw.   The TLIF was then performed with stereotactic guidance. A complete facetectomy was performed on the left. The traversing and exiting roots were identified and protected, a discectomy was perfomed followed by endplate preparation. The bone was saved and used as autograft, which was packed into the disc space and the interbody. A titanium interbody was then placed with stereotactic guidance, expanded, back-filled, and released.  Decompression was performed, which consisted of a decompressive laminectomy at L3-4 using a combination of high speed drill and rongeurs.   The new pedicle screws were connected with the existing screws at L4 and L5- using new rods bilaterally and final tightened according to manufacturer torque specifications. The bone was thoroughly decorticated over the fusion surface and the previously resected bone fragments were morselized and used as autograft.   Alli Consentino PA was scrubbed and assisted with the entire procedure which included exposure, instrumentation, decompression, and closure.  All instrument and sponge counts were correct, the incision was then closed in layers. The patient was then returned to anesthesia for emergence. No apparent complications at the completion of the procedure.   EBL:    DRAINS: none   SPECIMENS: none   Jadene Pierini, MD

## 2022-08-21 NOTE — Transfer of Care (Signed)
Immediate Anesthesia Transfer of Care Note  Patient: David Tyler  Procedure(s) Performed: Extension of fusion to Lumbar three-four with laminectomy and decompression, transforaminal lumbar interbody fusion and posterolateral instrumentation (Spine Lumbar) Application of O-Arm  Patient Location: PACU  Anesthesia Type:General  Level of Consciousness: awake, alert , and oriented  Airway & Oxygen Therapy: Patient Spontanous Breathing  Post-op Assessment: Report given to RN, Post -op Vital signs reviewed and stable, and Patient moving all extremities  Post vital signs: Reviewed and stable  Last Vitals:  Vitals Value Taken Time  BP 122/67 08/21/22 1355  Temp    Pulse 60 08/21/22 1358  Resp 11 08/21/22 1358  SpO2 96 % 08/21/22 1358  Vitals shown include unfiled device data.  Last Pain:  Vitals:   08/21/22 0813  TempSrc:   PainSc: 0-No pain         Complications: There were no known notable events for this encounter.

## 2022-08-21 NOTE — H&P (Signed)
Surgical H&P Update  HPI: 72 y.o. with a history of prior claudication s/p decompression and PSIF with recurrent symptoms and rapidly progressive adjacent segment disease. No changes in health since they were last seen. Still having the above and wishes to proceed with surgery.  PMHx:  Past Medical History:  Diagnosis Date   Arthritis    Hypertension    Pre-diabetes    patient denies this dx as of 08/15/22   FamHx: History reviewed. No pertinent family history. SocHx:  reports that he has never smoked. He has never used smokeless tobacco. He reports current alcohol use of about 5.0 standard drinks of alcohol per week. He reports that he does not use drugs.  Physical Exam: Strength 5/5 x4 and SILTx4 except some mild bilateral foot numbness to the mid-calf  Assesment/Plan: 72 y.o. man with adjacent segment disease and claudication, here for extension of decompression and fusion. Risks, benefits, and alternatives discussed and the patient would like to continue with surgery.  -OR today -3C post-op  Jadene Pierini, MD 08/21/22 10:08 AM

## 2022-08-21 NOTE — Anesthesia Preprocedure Evaluation (Signed)
Anesthesia Evaluation  Patient identified by MRN, date of birth, ID band Patient awake    Reviewed: Allergy & Precautions, H&P , NPO status , Patient's Chart, lab work & pertinent test results  Airway Mallampati: II   Neck ROM: full    Dental   Pulmonary neg pulmonary ROS   breath sounds clear to auscultation       Cardiovascular hypertension,  Rhythm:regular Rate:Normal     Neuro/Psych  Neuromuscular disease    GI/Hepatic   Endo/Other    Renal/GU      Musculoskeletal  (+) Arthritis ,    Abdominal   Peds  Hematology   Anesthesia Other Findings   Reproductive/Obstetrics                             Anesthesia Physical Anesthesia Plan  ASA: 3  Anesthesia Plan: General   Post-op Pain Management:    Induction: Intravenous  PONV Risk Score and Plan: 2 and Ondansetron, Dexamethasone and Treatment may vary due to age or medical condition  Airway Management Planned: Oral ETT  Additional Equipment:   Intra-op Plan:   Post-operative Plan: Extubation in OR  Informed Consent: I have reviewed the patients History and Physical, chart, labs and discussed the procedure including the risks, benefits and alternatives for the proposed anesthesia with the patient or authorized representative who has indicated his/her understanding and acceptance.     Dental advisory given  Plan Discussed with: CRNA, Anesthesiologist and Surgeon  Anesthesia Plan Comments:        Anesthesia Quick Evaluation

## 2022-08-22 ENCOUNTER — Encounter (HOSPITAL_COMMUNITY): Payer: Self-pay | Admitting: Neurological Surgery

## 2022-08-22 MED ORDER — TRAMADOL HCL 50 MG PO TABS: 50.0000 mg | ORAL_TABLET | Freq: Four times a day (QID) | ORAL | 0 refills | Status: DC | PRN

## 2022-08-22 MED ORDER — CYCLOBENZAPRINE HCL 10 MG PO TABS: 10.0000 mg | ORAL_TABLET | Freq: Three times a day (TID) | ORAL | 0 refills | Status: AC | PRN

## 2022-08-22 NOTE — Discharge Summary (Signed)
Discharge Summary  Date of Admission: 08/21/2022  Date of Discharge: 08/22/22  Attending Physician: Autumn Patty, MD  Hospital Course: Patient was admitted following an uncomplicated L3-4 lami / TLIF and extension of PSF. They were recovered in PACU and transferred to [facility]. Their preop symptoms were improved, their hospital course was uncomplicated and the patient was discharged home today. They will follow up in clinic with me in clinic in 2 weeks.  Neurologic exam at discharge:  Strength 5/5 x4 and SILTx4, incision c/d/I   Discharge diagnosis: Lumbar adjacent segment disease, lumbar stenosis with neurogenic claudication   Iran Sizer, PA-C 08/22/22 9:05 AM

## 2022-08-22 NOTE — Progress Notes (Signed)
PT Cancellation Note and Discharge  Patient Details Name: David Tyler MRN: [id number] DOB: 03/29/1950   Cancelled Treatment:    Reason Eval/Treat Not Completed: PT screened, no needs identified, will sign off. Discussed pt case with OT who reports pt is currently mobilizing without assistance and does not require a formal PT evaluation at this time. PT signing off. If needs change, please reconsult.     Marylynn Pearson 08/22/2022, 9:16 AM  Conni Slipper, PT, DPT Acute Rehabilitation Services Secure Chat Preferred Office: [phone]

## 2022-08-22 NOTE — Progress Notes (Signed)
AVS reviewed. Patient verbalizes understanding of medication regimen and necessary follow up appointments.

## 2022-08-22 NOTE — Progress Notes (Signed)
Occupational Therapy Evaluation Patient Details Name: David Tyler MRN: [id number] DOB: August 11, 1950 Today's Date: 08/22/2022   History of Present Illness 72 yo male s/p 08/21/22 extension of fusion L3-4 with laminectomy and decompression TLIF and posterolateral instrucmentation PMH arthritis HTN   Clinical Impression   Patient evaluated by Occupational Therapy with no further acute OT needs identified. All education has been completed and the patient has no further questions. See below for any follow-up Occupational Therapy or equipment needs. OT to sign off. Thank you for referral.         If plan is discharge home, recommend the following:      Functional Status Assessment     Equipment Recommendations  None recommended by OT    Recommendations for Other Services       Precautions / Restrictions Precautions Precautions: Fall;Back      Mobility Bed Mobility               General bed mobility comments: oob on arrival    Transfers Overall transfer level: Independent                        Balance Overall balance assessment: Modified Independent                                         ADL either performed or assessed with clinical judgement   ADL Overall ADL's : Independent                                       General ADL Comments: pt dressed on arrival . reviewed dressing and had patient demonstrate LB dressing. pt sitting eob with hip hitch method with propr body mechanics. pt complete 3 step transfer. pt walked back down the 3 steps backward with cues to come down forward prior. pt with neighbor driving pt home today from hospitalization.  Back handout provided and reviewed adls in detail. Pt educated on:  set an alarm at night for medication, avoid sitting for long periods of time, correct bed positioning for sleeping, correct sequence for bed mobility, avoiding lifting more than 5 pounds and never wash directly  over incision. All education is complete and patient indicates understanding. Spoke with PT regarding screen of patient.     Vision Baseline Vision/History: 0 No visual deficits       Perception         Praxis         Pertinent Vitals/Pain Pain Assessment Pain Assessment: No/denies pain     Extremity/Trunk Assessment Upper Extremity Assessment Upper Extremity Assessment: Overall WFL for tasks assessed   Lower Extremity Assessment Lower Extremity Assessment: Overall WFL for tasks assessed   Cervical / Trunk Assessment Cervical / Trunk Assessment: Back Surgery   Communication Communication Communication: No apparent difficulties   Cognition Arousal: Alert Behavior During Therapy: WFL for tasks assessed/performed Overall Cognitive Status: Within Functional Limits for tasks assessed                                       General Comments  incision dry and intact. Answer question about shower/ dressing. PT advised to follow discharge written schedule for showering and removal of  dressing as indicated    Exercises     Shoulder Instructions      Home Living Family/patient expects to be discharged to:: Private residence Living Arrangements: Spouse/significant other Available Help at Discharge: Family;Available PRN/intermittently Type of Home: House Home Access: Stairs to enter [employer] of Steps: 1   Home Layout: One level     Bathroom Shower/Tub: Chief Strategy Officer: Standard     Home Equipment: Agricultural consultant (2 wheels);Tub bench;Wheelchair - manual   Additional Comments: no animals      Prior Functioning/Environment Prior Level of Function : Independent/Modified Independent             Mobility Comments: Independent, works as a Psychologist, occupational, does all IADLs as wife in w/c, drives ADLs Comments: indep        OT Problem List:        OT Treatment/Interventions:      OT Goals(Current goals can be found in  the care plan section) Acute Rehab OT Goals 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 this morning  OT Frequency:      Co-evaluation              AM-PAC OT "6 Clicks" Daily Activity     Outcome Measure Help from another person eating meals?: None Help from another person taking care of personal grooming?: None Help from another person toileting, which includes using toliet, bedpan, or urinal?: None Help from another person bathing (including washing, rinsing, drying)?: None Help from another person to put on and taking off regular upper body clothing?: None Help from another person to put on and taking off regular lower body clothing?: None 6 Click Score: 24   End of Session Nurse Communication: Mobility status  Activity Tolerance: Patient tolerated treatment well Patient left: in chair;with call bell/phone within reach  OT Visit Diagnosis: Unsteadiness on feet (R26.81)                Time: 6213-0865 OT Time Calculation (min): 10 min Charges:  OT General Charges $OT Visit: 1 Visit OT Evaluation $OT Eval Low Complexity: 1 Low   Brynn, OTR/L  Acute Rehabilitation Services Office: [phone] .   Mateo Flow 08/22/2022, 11:53 AM

## 2022-08-22 NOTE — Progress Notes (Addendum)
Neurosurgery Service Progress Note  Subjective: No acute events overnight. Improvement in LE radicular pain and back pain. No voiced complaints.    Objective: Vitals:   08/21/22 2000 08/21/22 2250 08/22/22 0414 08/22/22 0730  BP: 138/73 122/74 119/69 128/65  Pulse: 81 78 66 67  Resp: 18 20 18 18   Temp: 97.9 F (36.6 C) 98.4 F (36.9 C) 98.7 F (37.1 C) 98.4 F (36.9 C)  TempSrc: Oral Oral Oral Oral  SpO2: 95% 95% 99% 98%  Weight:      Height:        Physical Exam: Strength 5/5 x4 and SILTx4, incision c/d/I   Assessment & Plan: 72 y.o. male s/p L3-4 lami / TLIF and extension of PSF, recovering well.  -PT/OT -discharge home today   Emilee Hero, PA-C 08/22/22 9:04 AM

## 2022-10-30 ENCOUNTER — Telehealth: Payer: Self-pay

## 2022-10-30 DIAGNOSIS — R29898 Other symptoms and signs involving the musculoskeletal system: Secondary | ICD-10-CM

## 2022-10-30 DIAGNOSIS — R200 Anesthesia of skin: Secondary | ICD-10-CM

## 2022-10-30 NOTE — Telephone Encounter (Signed)
Per Dr Elease Hashimoto, pt reports having leg heaviness, weakness and tingling. Pt's PCP recommends he have a vascular consult. Pt spoke with Dr Elease Hashimoto and he would like to proceed with PV consult. Order placed at this time.

## 2022-11-05 ENCOUNTER — Other Ambulatory Visit: Payer: Self-pay | Admitting: *Deleted

## 2022-11-05 DIAGNOSIS — R200 Anesthesia of skin: Secondary | ICD-10-CM

## 2022-11-05 DIAGNOSIS — R29898 Other symptoms and signs involving the musculoskeletal system: Secondary | ICD-10-CM

## 2022-11-07 ENCOUNTER — Ambulatory Visit (HOSPITAL_COMMUNITY)
Admission: RE | Admit: 2022-11-07 | Discharge: 2022-11-07 | Disposition: A | Discharge status: Home / Self Care | Payer: PPO | Source: Ambulatory Visit | Attending: Internal Medicine | Admitting: Internal Medicine

## 2022-11-07 DIAGNOSIS — R202 Paresthesia of skin: Secondary | ICD-10-CM | POA: Diagnosis not present

## 2022-11-07 DIAGNOSIS — R29898 Other symptoms and signs involving the musculoskeletal system: Secondary | ICD-10-CM | POA: Insufficient documentation

## 2022-11-07 DIAGNOSIS — R200 Anesthesia of skin: Secondary | ICD-10-CM | POA: Insufficient documentation

## 2022-11-09 LAB — VAS US ABI WITH/WO TBI
Left ABI: 1.17
Right ABI: 1.2

## 2022-11-20 ENCOUNTER — Encounter: Payer: Self-pay | Admitting: Cardiovascular Disease

## 2022-11-20 ENCOUNTER — Ambulatory Visit: Payer: PPO | Attending: Cardiovascular Disease | Admitting: Cardiovascular Disease

## 2022-11-20 VITALS — BP 160/90 | HR 57 | Ht 70.0 in | Wt 253.6 lb

## 2022-11-20 DIAGNOSIS — M48062 Spinal stenosis, lumbar region with neurogenic claudication: Secondary | ICD-10-CM | POA: Diagnosis not present

## 2022-11-20 DIAGNOSIS — R202 Paresthesia of skin: Secondary | ICD-10-CM

## 2022-11-20 DIAGNOSIS — I10 Essential (primary) hypertension: Secondary | ICD-10-CM

## 2022-11-20 DIAGNOSIS — R200 Anesthesia of skin: Secondary | ICD-10-CM

## 2022-11-20 NOTE — Progress Notes (Signed)
Cardiology Office Note   Date:  11/20/2022   ID:  David Tyler, DOB August 19, 1950, MRN [id number]  PCP:  Blair Heys, MD (Inactive)  Cardiologist:   Lorine Bears, MD   No chief complaint on file.     History of Present Illness: David Tyler is a 72 y.o. male who was referred by Dr. Marcia Brash for evaluation of lower extremity pain and possible peripheral arterial disease.  He has no prior cardiac history.  He has known history of essential hypertension.  He has no history of diabetes, tobacco use or known hyperlipidemia.  He reports no family history of premature coronary artery disease. He underwent 2 back surgeries this year 1 in March and 1 in August.  His back pain is under reasonable control.  However, he complains of bilateral leg pain from the knees down in the front and the back that happens with prolonged standing or walking.  It gets better with sitting down. He underwent recent lower extremity arterial Doppler which showed normal ABI bilaterally with triphasic waveforms.    Past Medical History:  Diagnosis Date   Arthritis    Hypertension    Pre-diabetes    patient denies this dx as of 08/15/22    Past Surgical History:  Procedure Laterality Date   KNEE ARTHROSCOPY Right    MULTIPLE TOOTH EXTRACTIONS     full dentures   TRANSFORAMINAL LUMBAR INTERBODY FUSION (TLIF) WITH PEDICLE SCREW FIXATION 1 LEVEL N/A 03/15/2022   Procedure: Open Lumbar four-five Laminectomy with Transforaminal Lumbar Interbody Fusion and posterior instrumentation;  Surgeon: Jadene Pierini, MD;  Location: [facility] OR;  Service: Neurosurgery;  Laterality: N/A;   TRANSFORAMINAL LUMBAR INTERBODY FUSION (TLIF) WITH PEDICLE SCREW FIXATION 1 LEVEL N/A 08/21/2022   Procedure: Extension of fusion to Lumbar three-four with laminectomy and decompression, transforaminal lumbar interbody fusion and posterolateral instrumentation;  Surgeon: Jadene Pierini, MD;  Location: [facility] OR;  Service:  Neurosurgery;  Laterality: N/A;     Current Outpatient Medications  Medication Sig Dispense Refill   acetaminophen (TYLENOL) 500 MG tablet Take 1,000 mg by mouth every 6 (six) hours as needed for moderate pain.     ascorbic acid (VITAMIN C) 500 MG tablet Take 500 mg by mouth daily.     atenolol (TENORMIN) 50 MG tablet Take 50 mg by mouth daily.     cyanocobalamin (VITAMIN B12) 1000 MCG tablet Take 1,000 mcg by mouth daily.     econazole nitrate 1 % cream Apply 1 Application topically daily.     Glucos-Chondroit-Hyaluron-MSM (GLUCOSAMINE CHONDROITIN JOINT PO) Take 1 tablet by mouth daily.     hydrochlorothiazide (HYDRODIURIL) 25 MG tablet Take 25 mg by mouth daily.     lisinopril (ZESTRIL) 10 MG tablet Take 10 mg by mouth daily.     Multiple Vitamins-Minerals (MULTIVITAMIN WITH MINERALS) tablet Take 1 tablet by mouth daily.     pregabalin (LYRICA) 75 MG capsule Take 75 mg by mouth 2 (two) times daily.     triamcinolone ointment (KENALOG) 0.1 % Apply 1 Application topically daily as needed (Rash).     cyclobenzaprine (FLEXERIL) 10 MG tablet Take 1 tablet (10 mg total) by mouth 3 (three) times daily as needed for muscle spasms. (Patient not taking: Reported on 11/20/2022) 30 tablet 0   traMADol (ULTRAM) 50 MG tablet Take 1 tablet (50 mg total) by mouth every 6 (six) hours as needed for moderate pain. (Patient not taking: Reported on 11/20/2022) 20 tablet 0   No current  facility-administered medications for this visit.    Allergies:   Hydrocodone and Zostavax [zoster vaccine live]    Social History:  The patient  reports that he has never smoked. He has never used smokeless tobacco. He reports current alcohol use of about 5.0 standard drinks of alcohol per week. He reports that he does not use drugs.   Family History:  The patient's family history is not on file.    ROS:  Please see the history of present illness.   Otherwise, review of systems are positive for none.   All other systems  are reviewed and negative.    PHYSICAL EXAM: VS:  BP (!) 160/90 (BP Location: Right Arm, Patient Position: Sitting, Cuff Size: Normal)   Pulse (!) 57   Ht 5\' 10"  (1.778 m)   Wt 253 lb 9.6 oz (115 kg)   SpO2 95%   BMI 36.39 kg/m  , BMI Body mass index is 36.39 kg/m. GEN: Well nourished, well developed, in no acute distress  HEENT: normal  Neck: no JVD, carotid bruits, or masses Cardiac: RRR; no murmurs, rubs, or gallops,no edema  Respiratory:  clear to auscultation bilaterally, normal work of breathing GI: soft, nontender, nondistended, + BS MS: no deformity or atrophy  Skin: warm and dry, no rash Neuro:  Strength and sensation are intact Psych: euthymic mood, full affect Vascular: Femoral pulses normal bilaterally.  Posterior tibial and dorsalis pedis is palpable on both sides.   EKG:  EKG is ordered today. The ekg ordered today demonstrates : Sinus bradycardia with 1st degree A-V block When compared with ECG of 12-Mar-2022 08:20, No significant change was found      Recent Labs: 08/15/2022: BUN 20; Creatinine, Ser 1.01; Hemoglobin 13.8; Platelets 184; Potassium 3.3; Sodium 136    Lipid Panel No results found for: "CHOL", "TRIG", "HDL", "CHOLHDL", "VLDL", "LDLCALC", "LDLDIRECT"    Wt Readings from Last 3 Encounters:  11/20/22 253 lb 9.6 oz (115 kg)  08/21/22 256 lb (116.1 kg)  08/15/22 256 lb 12.8 oz (116.5 kg)            No data to display            ASSESSMENT AND PLAN:  1.  Neuro claudication: The patient has no evidence of peripheral arterial disease by physical exam or by arterial Doppler.  His lower extremity leg pain is likely related to his lumbar spine disease.  Continue evaluation by neurosurgery.  He reports some improvement in symptoms with Lyrica.  2.  Essential hypertension: His blood pressure is elevated today but he reports known history of whitecoat syndrome.  His blood pressure is normal at home.  Continue lisinopril, hydrochlorothiazide  and atenolol.    Disposition:   FU with me as needed.  Signed,  Lorine Bears, MD  11/20/2022 9:19 AM    [facility]

## 2022-11-20 NOTE — Patient Instructions (Signed)
Medication Instructions:  No changes *If you need a refill on your cardiac medications before your next appointment, please call your pharmacy*   Lab Work: None ordered If you have labs (blood work) drawn today and your tests are completely normal, you will receive your results only by: MyChart Message (if you have MyChart) OR A paper copy in the mail If you have any lab test that is abnormal or we need to change your treatment, we will call you to review the results.   Testing/Procedures: None ordered   Follow-Up: At [facility], you and your health needs are our priority.  As part of our continuing mission to provide you with exceptional heart care, we have created designated Provider Care Teams.  These Care Teams include your primary Cardiologist (physician) and Advanced Practice Providers (APPs -  Physician Assistants and Nurse Practitioners) who all work together to provide you with the care you need, when you need it.  We recommend signing up for the patient portal called "MyChart".  Sign up information is provided on this After Visit Summary.  MyChart is used to connect with patients for Virtual Visits (Telemedicine).  Patients are able to view lab/test results, encounter notes, upcoming appointments, etc.  Non-urgent messages can be sent to your provider as well.   To learn more about what you can do with MyChart, go to [email or web address].    Your next appointment:   As needed with Dr. Kirke Corin

## 2022-12-20 DIAGNOSIS — Z125 Encounter for screening for malignant neoplasm of prostate: Secondary | ICD-10-CM | POA: Diagnosis not present

## 2022-12-20 DIAGNOSIS — Z1211 Encounter for screening for malignant neoplasm of colon: Secondary | ICD-10-CM | POA: Diagnosis not present

## 2022-12-20 DIAGNOSIS — R7309 Other abnormal glucose: Secondary | ICD-10-CM | POA: Diagnosis not present

## 2022-12-20 DIAGNOSIS — Z23 Encounter for immunization: Secondary | ICD-10-CM | POA: Diagnosis not present

## 2022-12-20 DIAGNOSIS — G629 Polyneuropathy, unspecified: Secondary | ICD-10-CM | POA: Diagnosis not present

## 2022-12-20 DIAGNOSIS — Z0000 Encounter for general adult medical examination without abnormal findings: Secondary | ICD-10-CM | POA: Diagnosis not present

## 2022-12-20 DIAGNOSIS — E782 Mixed hyperlipidemia: Secondary | ICD-10-CM | POA: Diagnosis not present

## 2022-12-20 DIAGNOSIS — N529 Male erectile dysfunction, unspecified: Secondary | ICD-10-CM | POA: Diagnosis not present

## 2022-12-20 DIAGNOSIS — I10 Essential (primary) hypertension: Secondary | ICD-10-CM | POA: Diagnosis not present

## 2022-12-27 DIAGNOSIS — G609 Hereditary and idiopathic neuropathy, unspecified: Secondary | ICD-10-CM | POA: Diagnosis not present

## 2022-12-27 DIAGNOSIS — M5417 Radiculopathy, lumbosacral region: Secondary | ICD-10-CM | POA: Diagnosis not present

## 2023-01-04 DIAGNOSIS — Z1211 Encounter for screening for malignant neoplasm of colon: Secondary | ICD-10-CM | POA: Diagnosis not present

## 2023-02-14 ENCOUNTER — Other Ambulatory Visit: Payer: Self-pay | Admitting: Neurological Surgery

## 2023-02-14 DIAGNOSIS — M5416 Radiculopathy, lumbar region: Secondary | ICD-10-CM

## 2023-02-22 ENCOUNTER — Ambulatory Visit
Admission: RE | Admit: 2023-02-22 | Discharge: 2023-02-22 | Disposition: A | Discharge status: Home / Self Care | Payer: PPO | Source: Ambulatory Visit | Attending: Neurological Surgery | Admitting: Neurological Surgery

## 2023-02-22 DIAGNOSIS — M5416 Radiculopathy, lumbar region: Secondary | ICD-10-CM | POA: Insufficient documentation

## 2023-02-22 MED ORDER — GADOBUTROL 1 MMOL/ML IV SOLN
10.0000 mL | Freq: Once | INTRAVENOUS | Status: AC | PRN
Start: 2023-02-22 — End: 2023-02-22
  Administered 2023-02-22: 10 mL via INTRAVENOUS

## 2023-06-18 DIAGNOSIS — M48061 Spinal stenosis, lumbar region without neurogenic claudication: Secondary | ICD-10-CM | POA: Insufficient documentation

## 2023-06-18 DIAGNOSIS — G629 Polyneuropathy, unspecified: Secondary | ICD-10-CM | POA: Insufficient documentation

## 2023-06-18 DIAGNOSIS — G5702 Lesion of sciatic nerve, left lower limb: Secondary | ICD-10-CM | POA: Insufficient documentation

## 2023-06-21 ENCOUNTER — Encounter: Payer: Self-pay | Admitting: Neurology

## 2023-06-21 ENCOUNTER — Ambulatory Visit: Admitting: Neurology

## 2023-06-21 VITALS — BP 143/73 | HR 59 | Ht 70.0 in | Wt 253.0 lb

## 2023-06-21 DIAGNOSIS — M79605 Pain in left leg: Secondary | ICD-10-CM

## 2023-06-21 DIAGNOSIS — M79604 Pain in right leg: Secondary | ICD-10-CM | POA: Insufficient documentation

## 2023-06-21 DIAGNOSIS — R269 Unspecified abnormalities of gait and mobility: Secondary | ICD-10-CM | POA: Diagnosis not present

## 2023-06-21 MED ORDER — DULOXETINE HCL 60 MG PO CPEP: 60.0000 mg | ORAL_CAPSULE | Freq: Every day | ORAL | 12 refills | Status: DC

## 2023-06-21 MED ORDER — DULOXETINE HCL 30 MG PO CPEP: 30.0000 mg | ORAL_CAPSULE | Freq: Every day | ORAL | 0 refills | Status: DC

## 2023-06-21 NOTE — Progress Notes (Signed)
 Chief Complaint  Patient presents with   Numbness    Rm 14 with spouse Pt is well, reports he is having aching pain and numbness in BLE and tightness in his ankles. He had a lumbar fusion in Aug 2024 but symptoms did not improve. Lyrica  has helped.      ASSESSMENT AND PLAN  David Tyler is a 73 y.o. 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   MEDICAL HISTORY:  David Tyler is a 73 year old male, seen in request by neurosurgeon Dr.   Waymond Hailey for evaluation of bilateral lower extremity pain, his primary care is from [facility] Dr. Claudius Cumins, Gregory Leash, he is with his wife at today's visit on June 21, 2023  History is obtained from the patient and review of electronic medical records. I personally reviewed pertinent available imaging films in PACS.   PMHx of  HTN Lumbar decompression,  March 7, then August 21 2022    He fell in February 2020 for stepping out of his truck, had developed significant low back pain radiating pain to bilateral lower extremity  He was evaluated by neurosurgeon Dr. 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 Dorothy Gates August 21, 2022, L3-4 open decompressive laminectomy, L3-4 transforaminal lumbar interbody fusion, extension of fusion to L3-4  He continues to have lower extremity deep achy pain, as if his ankle and feet will squeeze tight and then release, after walking short distance, he felt increased achy pain below knee level, sitting down for few minutes would help, leaning on shopping cart was helpful to  He is not walking has changed, become wobbly, denies significant low back pain, denies upper extremity involvement   PHYSICAL EXAM:   Vitals:   06/21/23 1023  BP: (!) 143/73  Pulse: (!) 59  Weight: 253 lb (114.8 kg)  Height: 5' 10 (1.778 m)   Body mass index is 36.3 kg/m.  PHYSICAL EXAMNIATION:  Gen: NAD, conversant, well nourised, well groomed  Cardiovascular: Regular rate rhythm, no peripheral edema, warm, nontender. Eyes: Conjunctivae clear without exudates or hemorrhage Neck: Supple, no carotid bruits. Pulmonary: Clear to auscultation bilaterally   NEUROLOGICAL EXAM:  MENTAL STATUS: Speech/cognition: Awake, alert, oriented to history taking and casual  conversation CRANIAL NERVES: CN II: Visual fields are full to confrontation. Pupils are round equal and briskly reactive to light. CN III, IV, VI: extraocular movement are normal. No ptosis. CN V: Facial sensation is intact to light touch CN VII: Face is symmetric with normal eye closure  CN VIII: Hearing is normal to causal conversation. CN IX, X: Phonation is normal. CN XI: Head turning and shoulder shrug are intact  MOTOR: There is no pronator drift of out-stretched arms. Muscle bulk and tone are normal. Muscle strength is normal.  REFLEXES: Reflexes are 1 and symmetric at the biceps, triceps, 2 knees, and trace ankles. Plantar responses are extensor bilaterally  SENSORY: Length-dependent decreased vibratory sensation, pinprick to distal shin level, decreased to toe proprioception  COORDINATION: There is no trunk or limb dysmetria noted.  GAIT/STANCE: Multiple effort to get up from seated position arm crossed, stiff, toes pointed upwards, swing his pelvis, mildly unsteady  REVIEW OF SYSTEMS:  Full 14 system review of systems performed and notable only for as above All other review of systems were negative.   ALLERGIES: Allergies  Allergen Reactions   Hydrocodone Itching   Zostavax [Zoster Vaccine Live] Other (See Comments)    Rash at injection site    HOME MEDICATIONS: Current Outpatient Medications  Medication Sig Dispense Refill   acetaminophen  (TYLENOL ) 500 MG tablet Take 1,000 mg by mouth every 6 (six) hours as needed for moderate pain.     ascorbic acid (VITAMIN C) 500 MG tablet Take 500 mg by mouth daily.     atenolol  (TENORMIN ) 50 MG tablet Take 50 mg by mouth daily.     cyanocobalamin (VITAMIN B12) 1000 MCG tablet Take 1,000 mcg by mouth daily.     cyclobenzaprine  (FLEXERIL ) 10 MG tablet Take 1 tablet (10 mg total) by mouth 3 (three) times daily as needed for muscle spasms. 30 tablet 0   econazole nitrate 1 % cream Apply 1 Application topically daily.      Glucos-Chondroit-Hyaluron-MSM (GLUCOSAMINE CHONDROITIN JOINT PO) Take 1 tablet by mouth daily.     hydrochlorothiazide  (HYDRODIURIL ) 25 MG tablet Take 25 mg by mouth daily.     lisinopril  (ZESTRIL ) 10 MG tablet Take 10 mg by mouth daily.     Multiple Vitamins-Minerals (MULTIVITAMIN WITH MINERALS) tablet Take 1 tablet by mouth daily.     pregabalin  (LYRICA ) 75 MG capsule Take 75 mg by mouth 2 (two) times daily.     triamcinolone ointment (KENALOG) 0.1 % Apply 1 Application topically daily as needed (Rash).     No current facility-administered medications for this visit.    PAST MEDICAL HISTORY: Past Medical History:  Diagnosis Date   Arthritis    Hypertension    Pre-diabetes    patient denies this dx as of 08/15/22    PAST SURGICAL HISTORY: Past Surgical History:  Procedure Laterality Date   KNEE ARTHROSCOPY Right    MULTIPLE TOOTH EXTRACTIONS     full dentures   TRANSFORAMINAL LUMBAR INTERBODY FUSION (TLIF) WITH PEDICLE SCREW FIXATION 1 LEVEL N/A 03/15/2022   Procedure: Open Lumbar four-five Laminectomy with Transforaminal Lumbar Interbody Fusion and posterior instrumentation;  Surgeon: Cannon Champion, MD;  Location: [facility] OR;  Service: Neurosurgery;  Laterality: N/A;   TRANSFORAMINAL LUMBAR INTERBODY FUSION (  TLIF) WITH PEDICLE SCREW FIXATION 1 LEVEL N/A 08/21/2022   Procedure: Extension of fusion to Lumbar three-four with laminectomy and decompression, transforaminal lumbar interbody fusion and posterolateral instrumentation;  Surgeon: Cannon Champion, MD;  Location: [facility] OR;  Service: Neurosurgery;  Laterality: N/A;    FAMILY HISTORY: History reviewed. No pertinent family history.  SOCIAL HISTORY: Social History   Socioeconomic History   Marital status: Married    Spouse name: Not on file   Number of children: Not on file   Years of education: Not on file   Highest education level: Not on file  Occupational History   Not on file  Tobacco Use   Smoking status: Never    Smokeless tobacco: Never  Vaping Use   Vaping status: Never Used  Substance and Sexual Activity   Alcohol use: Yes    Alcohol/week: 5.0 standard drinks of alcohol    Types: 5 Standard drinks or equivalent per week    Comment: beer/liquor   Drug use: Never   Sexual activity: Yes  Other Topics Concern   Not on file  Social History Narrative   Not on file   Social Drivers of Health   Financial Resource Strain: Not on file  Food Insecurity: Not on file  Transportation Needs: Not on file  Physical Activity: Not on file  Stress: Not on file  Social Connections: Not on file  Intimate Partner Violence: Not on file      Phebe Brasil, M.D. Ph.D.  [facility] [address] [place], [place] [address] Ph: [phone] Fax: [phone]  CC:  Joaquin Mulberry, MD [address] [place],  [place] [address]  Elida Grounds, DO

## 2023-06-22 LAB — CK: Total CK: 124 U/L (ref 41–331)

## 2023-06-22 LAB — CBC WITH DIFFERENTIAL/PLATELET
Basophils Absolute: 0.1 10*3/uL (ref 0.0–0.2)
Basos: 1 %
EOS (ABSOLUTE): 0.2 10*3/uL (ref 0.0–0.4)
Eos: 3 %
Hematocrit: 44.6 % (ref 37.5–51.0)
Hemoglobin: 14.4 g/dL (ref 13.0–17.7)
Immature Grans (Abs): 0 10*3/uL (ref 0.0–0.1)
Immature Granulocytes: 0 %
Lymphocytes Absolute: 2.3 10*3/uL (ref 0.7–3.1)
Lymphs: 32 %
MCH: 30.6 pg (ref 26.6–33.0)
MCHC: 32.3 g/dL (ref 31.5–35.7)
MCV: 95 fL (ref 79–97)
Monocytes Absolute: 0.8 10*3/uL (ref 0.1–0.9)
Monocytes: 12 %
Neutrophils Absolute: 3.8 10*3/uL (ref 1.4–7.0)
Neutrophils: 52 %
Platelets: 220 10*3/uL (ref 150–450)
RBC: 4.7 x10E6/uL (ref 4.14–5.80)
RDW: 12.6 % (ref 11.6–15.4)
WBC: 7.2 10*3/uL (ref 3.4–10.8)

## 2023-06-22 LAB — COMPREHENSIVE METABOLIC PANEL WITH GFR
ALT: 18 IU/L (ref 0–44)
AST: 21 IU/L (ref 0–40)
Albumin: 4.6 g/dL (ref 3.8–4.8)
Alkaline Phosphatase: 69 IU/L (ref 44–121)
BUN/Creatinine Ratio: 22 (ref 10–24)
BUN: 23 mg/dL (ref 8–27)
Bilirubin Total: 0.5 mg/dL (ref 0.0–1.2)
CO2: 20 mmol/L (ref 20–29)
Calcium: 9.5 mg/dL (ref 8.6–10.2)
Chloride: 102 mmol/L (ref 96–106)
Creatinine, Ser: 1.04 mg/dL (ref 0.76–1.27)
Globulin, Total: 2.6 g/dL (ref 1.5–4.5)
Glucose: 140 mg/dL — ABNORMAL HIGH (ref 70–99)
Potassium: 4.1 mmol/L (ref 3.5–5.2)
Sodium: 142 mmol/L (ref 134–144)
Total Protein: 7.2 g/dL (ref 6.0–8.5)
eGFR: 76 mL/min/{1.73_m2} (ref >59)

## 2023-06-22 LAB — ANA W/REFLEX IF POSITIVE: Anti Nuclear Antibody (ANA): NEGATIVE

## 2023-06-22 LAB — VITAMIN B12: Vitamin B-12: 1178 pg/mL (ref 232–1245)

## 2023-06-22 LAB — RPR: RPR Ser Ql: NONREACTIVE

## 2023-06-22 LAB — SEDIMENTATION RATE: Sed Rate: 32 mm/h — ABNORMAL HIGH (ref 0–30)

## 2023-06-22 LAB — C-REACTIVE PROTEIN: CRP: 2 mg/L (ref 0–10)

## 2023-06-22 LAB — TSH: TSH: 1.5 u[IU]/mL (ref 0.450–4.500)

## 2023-06-24 ENCOUNTER — Telehealth: Payer: Self-pay | Admitting: Neurology

## 2023-06-24 ENCOUNTER — Ambulatory Visit: Payer: Self-pay | Admitting: Neurology

## 2023-06-24 NOTE — Telephone Encounter (Signed)
 no auth required sent to [facility] [phone]

## 2023-06-26 ENCOUNTER — Other Ambulatory Visit: Payer: Self-pay | Admitting: Neurology

## 2023-06-26 DIAGNOSIS — M79604 Pain in right leg: Secondary | ICD-10-CM

## 2023-06-26 DIAGNOSIS — T1590XA Foreign body on external eye, part unspecified, unspecified eye, initial encounter: Secondary | ICD-10-CM

## 2023-07-03 ENCOUNTER — Telehealth: Payer: Self-pay | Admitting: Neurology

## 2023-07-03 NOTE — Telephone Encounter (Signed)
 Call to wife, she reports patient is not home but that he took the Cymbalta  for 3 days and stopped due to side effects of tremors, sweats, and nausea. Wife also states patient would like to change providers as he feels there is a communication barrier and would feel comfortable with another provider. Advised I would send to Dr. Onita to inform her

## 2023-07-03 NOTE — Telephone Encounter (Signed)
Ok to see different provider

## 2023-07-03 NOTE — Addendum Note (Signed)
 Addended by: JOSHUA IZETTA CROME on: 07/03/2023 04:19 PM   Modules accepted: Orders

## 2023-07-03 NOTE — Telephone Encounter (Signed)
 Pt reports that DULoxetine  (CYMBALTA ) is too strong, pt no longer wants to take this, he'd like a call to discuss

## 2023-07-08 ENCOUNTER — Ambulatory Visit
Admission: RE | Admit: 2023-07-08 | Discharge: 2023-07-08 | Disposition: A | Discharge status: Home / Self Care | Source: Ambulatory Visit | Attending: Neurology | Admitting: Neurology

## 2023-07-08 DIAGNOSIS — M79604 Pain in right leg: Secondary | ICD-10-CM

## 2023-07-08 DIAGNOSIS — T1590XA Foreign body on external eye, part unspecified, unspecified eye, initial encounter: Secondary | ICD-10-CM

## 2023-07-08 DIAGNOSIS — M79605 Pain in left leg: Secondary | ICD-10-CM | POA: Diagnosis not present

## 2023-07-16 ENCOUNTER — Ambulatory Visit: Admitting: Neurology

## 2023-12-16 ENCOUNTER — Ambulatory Visit: Admitting: Neurology

## 2024-01-24 ENCOUNTER — Other Ambulatory Visit: Payer: Self-pay | Admitting: Neurological Surgery

## 2024-01-24 DIAGNOSIS — M48062 Spinal stenosis, lumbar region with neurogenic claudication: Secondary | ICD-10-CM

## 2024-01-28 ENCOUNTER — Ambulatory Visit
Admission: RE | Admit: 2024-01-28 | Discharge: 2024-01-28 | Disposition: A | Source: Ambulatory Visit | Attending: Neurological Surgery | Admitting: Neurological Surgery

## 2024-01-28 DIAGNOSIS — M48062 Spinal stenosis, lumbar region with neurogenic claudication: Secondary | ICD-10-CM

## 2024-02-07 ENCOUNTER — Other Ambulatory Visit: Payer: Self-pay | Admitting: Neurological Surgery

## 2024-02-28 ENCOUNTER — Ambulatory Visit (HOSPITAL_COMMUNITY): Admit: 2024-02-28 | Admitting: Neurological Surgery
